# Patient Record
Sex: Female | Born: 1980
Health system: Southern US, Community
[De-identification: ages and names within clinical notes are randomized; demographics above are authoritative.]

## PROBLEM LIST (undated history)

## (undated) ENCOUNTER — Emergency Department (HOSPITAL_COMMUNITY): Admission: EM | Payer: BLUE CROSS/BLUE SHIELD | Source: Home / Self Care

## (undated) DIAGNOSIS — M459 Ankylosing spondylitis of unspecified sites in spine: Secondary | ICD-10-CM

## (undated) DIAGNOSIS — I201 Angina pectoris with documented spasm: Principal | ICD-10-CM

## (undated) HISTORY — DX: Ankylosing spondylitis of unspecified sites in spine: M45.9

## (undated) HISTORY — PX: NOSE SURGERY: SHX723

## (undated) HISTORY — DX: Angina pectoris with documented spasm: I20.1

---

## 2003-08-30 ENCOUNTER — Ambulatory Visit (HOSPITAL_COMMUNITY): Admission: RE | Admit: 2003-08-30 | Discharge: 2003-08-30 | Payer: Self-pay | Admitting: Preventative Medicine

## 2003-10-20 ENCOUNTER — Ambulatory Visit (HOSPITAL_COMMUNITY): Admission: RE | Admit: 2003-10-20 | Discharge: 2003-10-20 | Payer: Self-pay | Admitting: Internal Medicine

## 2004-06-20 ENCOUNTER — Ambulatory Visit (HOSPITAL_COMMUNITY): Admission: RE | Admit: 2004-06-20 | Discharge: 2004-06-20 | Payer: Self-pay | Admitting: Internal Medicine

## 2005-01-31 ENCOUNTER — Ambulatory Visit (HOSPITAL_COMMUNITY): Admission: RE | Admit: 2005-01-31 | Discharge: 2005-01-31 | Payer: Self-pay | Admitting: Orthopaedic Surgery

## 2007-09-17 HISTORY — PX: TONSILLECTOMY: SUR1361

## 2010-09-16 HISTORY — PX: TUBAL LIGATION: SHX77

## 2010-09-16 HISTORY — PX: ABLATION: SHX5711

## 2011-08-02 ENCOUNTER — Other Ambulatory Visit (HOSPITAL_COMMUNITY): Payer: Self-pay | Admitting: Pediatrics

## 2011-08-02 ENCOUNTER — Other Ambulatory Visit (HOSPITAL_COMMUNITY): Payer: Self-pay

## 2011-08-02 DIAGNOSIS — R05 Cough: Secondary | ICD-10-CM

## 2012-04-02 DIAGNOSIS — R079 Chest pain, unspecified: Secondary | ICD-10-CM

## 2013-03-06 ENCOUNTER — Encounter (HOSPITAL_COMMUNITY): Payer: Self-pay | Admitting: Emergency Medicine

## 2013-03-06 ENCOUNTER — Emergency Department (HOSPITAL_COMMUNITY): Payer: BC Managed Care – PPO

## 2013-03-06 ENCOUNTER — Emergency Department (HOSPITAL_COMMUNITY)
Admission: EM | Admit: 2013-03-06 | Discharge: 2013-03-06 | Disposition: A | Discharge status: Home / Self Care | Payer: BC Managed Care – PPO | Attending: Emergency Medicine | Admitting: Emergency Medicine

## 2013-03-06 DIAGNOSIS — S93409A Sprain of unspecified ligament of unspecified ankle, initial encounter: Secondary | ICD-10-CM | POA: Insufficient documentation

## 2013-03-06 DIAGNOSIS — Z79899 Other long term (current) drug therapy: Secondary | ICD-10-CM | POA: Insufficient documentation

## 2013-03-06 DIAGNOSIS — Y92009 Unspecified place in unspecified non-institutional (private) residence as the place of occurrence of the external cause: Secondary | ICD-10-CM | POA: Insufficient documentation

## 2013-03-06 DIAGNOSIS — Y9389 Activity, other specified: Secondary | ICD-10-CM | POA: Insufficient documentation

## 2013-03-06 DIAGNOSIS — S93401A Sprain of unspecified ligament of right ankle, initial encounter: Secondary | ICD-10-CM

## 2013-03-06 DIAGNOSIS — F17200 Nicotine dependence, unspecified, uncomplicated: Secondary | ICD-10-CM | POA: Insufficient documentation

## 2013-03-06 DIAGNOSIS — R296 Repeated falls: Secondary | ICD-10-CM | POA: Insufficient documentation

## 2013-03-06 MED ORDER — HYDROCODONE-ACETAMINOPHEN 5-325 MG PO TABS
1.0000 | ORAL_TABLET | Freq: Once | ORAL | Status: AC
  Administered 2013-03-06: 1 via ORAL
  Filled 2013-03-06: qty 1

## 2013-03-06 MED ORDER — HYDROCODONE-ACETAMINOPHEN 5-325 MG PO TABS
ORAL_TABLET | ORAL | Status: DC
Start: 2013-03-06 — End: 2014-02-14

## 2013-03-06 NOTE — ED Notes (Signed)
Injury to right ankle - fell off a porch- pain and mild swelling

## 2013-03-06 NOTE — ED Provider Notes (Signed)
History     CSN: [id number]  Arrival date & time 03/06/13  4696   First MD Initiated Contact with Patient 03/06/13 2026      Chief Complaint  Patient presents with  . Ankle Injury    (Consider location/radiation/quality/duration/timing/severity/associated sxs/prior treatment) Patient is a 32 y.o. female presenting with ankle pain. The history is provided by the patient.  Ankle Pain Location:  Ankle Injury: yes   Mechanism of injury: fall   Fall:    Fall occurred: fell short distance off a Scientist, physiological.     Impact surface:  Dirt   Point of impact:  Feet   Entrapped after fall: no   Ankle location:  R ankle Pain details:    Quality:  Aching and throbbing   Radiates to:  Does not radiate   Severity:  Moderate   Onset quality:  Sudden   Timing:  Constant   Progression:  Unchanged Chronicity:  New Dislocation: no   Foreign body present:  No foreign bodies Prior injury to area:  No Relieved by:  Nothing Worsened by:  Activity and bearing weight Ineffective treatments:  Elevation and ice Associated symptoms: decreased ROM and swelling   Associated symptoms: no back pain, no fatigue, no fever, no muscle weakness, no neck pain, no numbness and no tingling     History reviewed. No pertinent past medical history.  Past Surgical History  Procedure Laterality Date  . Tonsillectomy    . Cesarean section      x 2   . Tubal ligation      No family history on file.  History  Substance Use Topics  . Smoking status: Current Every Day Smoker -- 1.00 packs/day  . Smokeless tobacco: Not on file  . Alcohol Use: Yes     Comment: occasionally    OB History   Grav Para Term Preterm Abortions TAB SAB Ect Mult Living                  Review of Systems  Constitutional: Negative for fever, chills and fatigue.  HENT: Negative for neck pain.   Genitourinary: Negative for dysuria and difficulty urinating.  Musculoskeletal: Positive for joint swelling and arthralgias. Negative for  back pain.  Skin: Negative for color change and wound.  All other systems reviewed and are negative.    Allergies  Wellbutrin  Home Medications   Current Outpatient Rx  Name  Route  Sig  Dispense  Refill  . DULoxetine (CYMBALTA) 30 MG capsule   Oral   Take 30 mg by mouth daily.         . Multiple Vitamin (MULTIVITAMIN) tablet   Oral   Take 1 tablet by mouth daily.         Marland Kitchen HYDROcodone-acetaminophen (NORCO/VICODIN) 5-325 MG per tablet      Take one-two tabs po q 4-6 hrs prn pain   20 tablet   0     BP 102/54  Pulse 102  Temp(Src) 98.2 F (36.8 C) (Oral)  Resp 18  Ht 5\' 3"  (1.6 m)  Wt 149 lb (67.586 kg)  BMI 26.4 kg/m2  SpO2 100%  Physical Exam  Nursing note and vitals reviewed. Constitutional: She is oriented to person, place, and time. She appears well-developed and well-nourished. No distress.  HENT:  Head: Normocephalic and atraumatic.  Cardiovascular: Normal rate, regular rhythm, normal heart sounds and intact distal pulses.   Pulmonary/Chest: Effort normal and breath sounds normal.  Musculoskeletal: She exhibits edema and tenderness.  Right lateral  ankle is ttp, mild STS is present.  DP pulse is brisk, distal sensation intact.  No erythema, abrasion,  or bony deformity.  No proximal tenderness    Neurological: She is alert and oriented to person, place, and time. She exhibits normal muscle tone. Coordination normal.  Skin: Skin is warm and dry.    ED Course  Procedures (including critical care time)  Labs Reviewed - No data to display Dg Ankle Complete Right  03/06/2013   *RADIOLOGY REPORT*  Clinical Data: Twisted ankle.  RIGHT ANKLE - COMPLETE 3+ VIEW  Comparison: None.  Findings: The ankle mortise is maintained.  No acute ankle fracture or osteochondral lesion.  Possible small avulsion fracture off the lateral calcaneus.  IMPRESSION:  1.  No acute ankle fracture. 2.  Possible lateral calcaneal avulsion fracture.  Recommend clinical correlation.    Original Report Authenticated By: Rudie Meyer, M.D.     1. Sprain of ankle, right, initial encounter       MDM     Localized ttp of the right lateral ankle.  Poss avulsion injury per x-ray but pt is NT at the calcaneus.  ASO splint applied and crutches given.  She agrees to RICE therapy and close f/u with orthopedics.  referral info given.       Khloei Spiker L. Trisha Mangle, PA-C 03/09/13 1556

## 2013-03-10 NOTE — ED Provider Notes (Signed)
Medical screening examination/treatment/procedure(s) were performed by non-physician practitioner and as supervising physician I was immediately available for consultation/collaboration.  Donnetta Hutching, MD 03/10/13 [phone]

## 2013-12-15 ENCOUNTER — Encounter: Payer: Self-pay | Admitting: Neurology

## 2013-12-20 ENCOUNTER — Ambulatory Visit: Payer: BC Managed Care – PPO | Admitting: Neurology

## 2014-02-14 ENCOUNTER — Ambulatory Visit (INDEPENDENT_AMBULATORY_CARE_PROVIDER_SITE_OTHER): Payer: BC Managed Care – PPO | Admitting: Neurology

## 2014-02-14 ENCOUNTER — Encounter: Payer: Self-pay | Admitting: Neurology

## 2014-02-14 VITALS — BP 105/69 | HR 93 | Ht 64.5 in | Wt 156.0 lb

## 2014-02-14 DIAGNOSIS — R202 Paresthesia of skin: Secondary | ICD-10-CM | POA: Insufficient documentation

## 2014-02-14 DIAGNOSIS — G8929 Other chronic pain: Secondary | ICD-10-CM

## 2014-02-14 DIAGNOSIS — R209 Unspecified disturbances of skin sensation: Secondary | ICD-10-CM

## 2014-02-14 NOTE — Progress Notes (Signed)
[id number]    Provider:  Dr [name] Referring Provider: Valeria Batman, MD Primary Care Physician:  No primary provider on file.  CC:  Hip pain  HPI:  Ashlee [name] is a 33 y.o. female here as a referral from Dr. Cleophas Dunker for hip pain  Per records, symptoms began in 2006, all of a sudden experienced hip pain, no associated trauma. Was initially diagnosed with bursitis, had normal MRI L spine in 2006. Describes pain over both trochanteric bursa regions. Has tried cortisone injections, oral prednisone, analgesics with no benefit. Taking prednisone does seem to help. No groin, thigh or knee pain. She does note some popping and clicking of her hips bilaterally. Had MRI arthrogram of right hip which was normal. Describes pain as intermittent, will "flare up", the flare ups are severe enough that she has difficulty walking. Can get severe aching pain all the way down to her ankles. Denies any bowel bladder difficulties  Had question of colitis in 2004, had colonoscopy which was normal.   Review of Systems: Out of a complete 14 system review, the patient complains of only the following symptoms, and all other reviewed systems are negative. + chronic pain, allergies  History   Social History  . Marital Status: Married    Spouse Name: N/A    Number of Children: 3  . Years of Education: N/A   Occupational History  . office manager    Social History Main Topics  . Smoking status: Current Every Day Smoker -- 1.00 packs/day    Types: E-cigarettes  . Smokeless tobacco: Never Used     Comment: Quit in 07/2013  . Alcohol Use: Yes     Comment: occasionally  . Drug Use: No  . Sexual Activity: Yes   Other Topics Concern  . Not on file   Social History Narrative   Married with 3 children   Right handed   12 th   1 cup daily          No family history on file.  Past Medical History  Diagnosis Date  . Anxiety     Past Surgical History  Procedure  Laterality Date  . Tonsillectomy  2009  . Cesarean section  2007, 2012    x 2   . Tubal ligation  2012  . Ablation  2012    Current Outpatient Prescriptions  Medication Sig Dispense Refill  . DULoxetine (CYMBALTA) 30 MG capsule Take 30 mg by mouth daily.       No current facility-administered medications for this visit.    Allergies as of 02/14/2014 - Review Complete 02/14/2014  Allergen Reaction Noted  . Wellbutrin [bupropion] Hives 03/06/2013    Vitals: BP 105/69  Pulse 93  Ht 5' 4.5" (1.638 m)  Wt 156 lb (70.761 kg)  BMI 26.37 kg/m2 Last Weight:  Wt Readings from Last 1 Encounters:  02/14/14 156 lb (70.761 kg)   Last Height:   Ht Readings from Last 1 Encounters:  02/14/14 5' 4.5" (1.638 m)     Physical exam: Exam: Gen: NAD, conversant Eyes: anicteric sclerae, moist conjunctivae HENT: Atraumatic, oropharynx clear Neck: Trachea midline; supple,  Lungs: CTA, no wheezing, rales, rhonic                          CV: RRR, no MRG Abdomen: Soft, non-tender;  Extremities: No peripheral edema  Skin: Normal temperature, no rash,  Psych: Appropriate affect, pleasant  Neuro: MS: AA&Ox3,  appropriately interactive, normal affect   Attention: WORLD backwards  Speech: fluent w/o paraphasic error  Memory: good recent and remote recall  CN: PERRL, EOMI no nystagmus, no ptosis, sensation intact to LT V1-V3 bilat, face symmetric, no weakness, hearing grossly intact, palate elevates symmetrically, shoulder shrug 5/5 bilat,  tongue protrudes midline, no fasiculations noted.  Motor: normal bulk and tone Strength: 5/5  In all extremities  Coord: rapid alternating and point-to-point (FNF, HTS) movements intact.  Reflexes: symmetrical, bilat downgoing toes  Sens: LT intact in all extremities, intact PP, temp, vibration bilateral LE  Gait: posture, stance, stride and arm-swing normal. Tandem gait intact. Able to walk on heels and toes. Romberg absent.   Assessment:   After physical and neurologic examination, review of laboratory studies, imaging, neurophysiology testing and pre-existing records, assessment will be reviewed on the problem list.  Plan:  Treatment plan and additional workup will be reviewed under Problem List.  1)Chronic hip pain  33y/o woman presenting for initial evaluation of chronic hip pain that involves both hips (though only one at a time). Symptoms have been ongoing for >9 years, has had extensive orthopaedic and gyn workup with no clear etiology. Has had normal lumbar and hip MRIs. Unclear etiology of symptoms. Atypical for a meralagia paresthetica as pain radiates below the knee. Will check EMG/NCS and ANA and RF. Follow up once workup is completed.    Elspeth ChoPeter Layza Summa, DO  [facility] [address] [place], [place] [address]  Phone [phone] Fax [phone]

## 2014-02-14 NOTE — Patient Instructions (Addendum)
Overall you are doing fairly well but I do want to suggest a few things today:   As far as diagnostic testing:  1)Please have some blood work checked today 2)Please schedule an EMG/Nerve conduction study when you check out  Follow up once the workup is completed. Please call us with any interim questions, concerns, problems, updates or refill requests.   My clinical assistant and will answer any of your questions and relay your messages to me and also relay most of my messages to you.   Our phone number is [phone]. We also have an after hours call service for urgent matters and there is a physician on-call for urgent questions. For any emergencies you know to call 911 or go to the nearest emergency room

## 2014-02-15 ENCOUNTER — Telehealth: Payer: Self-pay | Admitting: *Deleted

## 2014-02-15 LAB — ANA W/REFLEX IF POSITIVE: Anti Nuclear Antibody(ANA): NEGATIVE

## 2014-02-15 LAB — RHEUMATOID FACTOR: Rheumatoid fact SerPl-aCnc: 7.4 IU/mL (ref 0.0–13.9)

## 2014-02-15 NOTE — Telephone Encounter (Signed)
Pt calling requesting lab results  Please advise

## 2014-02-15 NOTE — Telephone Encounter (Signed)
Please let her know her labs are normal. We will follow up after her EMG. Thanks

## 2014-02-16 NOTE — Telephone Encounter (Signed)
Called pt to inform pt per Dr. [name] that pt's lab work results were normal and that Dr. [name] will follow up after pt's EMG. I advised the pt that if she has any other problems, questions or concerns to call the office. Pt verbalized understanding.

## 2014-03-01 ENCOUNTER — Ambulatory Visit (INDEPENDENT_AMBULATORY_CARE_PROVIDER_SITE_OTHER): Payer: BC Managed Care – PPO | Admitting: Neurology

## 2014-03-01 ENCOUNTER — Encounter (INDEPENDENT_AMBULATORY_CARE_PROVIDER_SITE_OTHER): Payer: Self-pay

## 2014-03-01 DIAGNOSIS — M79609 Pain in unspecified limb: Secondary | ICD-10-CM

## 2014-03-01 DIAGNOSIS — Z0289 Encounter for other administrative examinations: Secondary | ICD-10-CM

## 2014-03-01 DIAGNOSIS — G8929 Other chronic pain: Secondary | ICD-10-CM

## 2014-03-01 DIAGNOSIS — M25559 Pain in unspecified hip: Secondary | ICD-10-CM

## 2014-03-01 DIAGNOSIS — R202 Paresthesia of skin: Secondary | ICD-10-CM

## 2014-03-01 NOTE — Procedures (Signed)
     HISTORY:  Ashlee [name] is a 33 year old patient with a 9 year history of intermittent discomfort involving the hips, occasionally going down into the knees and ankles bilaterally. The symptoms have become more persistent over the last one year. The patient is being evaluated for a possible neuropathy or a lumbosacral radiculopathy. MRI study of the lumbosacral spine done previously reportedly did not show any etiology for her discomfort.  NERVE CONDUCTION STUDIES:  Nerve conduction studies were performed on both lower extremities. The distal motor latencies and motor amplitudes for the peroneal and posterior tibial nerves were within normal limits. The nerve conduction velocities for these nerves were also normal. The H reflex latencies were normal. The sensory latencies for the peroneal nerves were within normal limits.   EMG STUDIES:  EMG study was performed on the right lower extremity:  The tibialis anterior muscle reveals 2 to 4K motor units with full recruitment. No fibrillations or positive waves were seen. The peroneus tertius muscle reveals 2 to 4K motor units with full recruitment. No fibrillations or positive waves were seen. The medial gastrocnemius muscle reveals 1 to 3K motor units with full recruitment. No fibrillations or positive waves were seen. The vastus lateralis muscle reveals 2 to 4K motor units with full recruitment. No fibrillations or positive waves were seen. The iliopsoas muscle reveals 2 to 4K motor units with full recruitment. No fibrillations or positive waves were seen. The biceps femoris muscle (long head) reveals 2 to 4K motor units with full recruitment. No fibrillations or positive waves were seen. The lumbosacral paraspinal muscles were tested at 3 levels, and revealed no abnormalities of insertional activity at all 3 levels tested. There was good relaxation.  EMG study was performed on the left lower extremity:  The tibialis anterior muscle reveals 2  to 4K motor units with full recruitment. No fibrillations or positive waves were seen. The peroneus tertius muscle reveals 2 to 4K motor units with full recruitment. No fibrillations or positive waves were seen. The medial gastrocnemius muscle reveals 1 to 3K motor units with full recruitment. No fibrillations or positive waves were seen. The vastus lateralis muscle reveals 2 to 4K motor units with full recruitment. No fibrillations or positive waves were seen. The iliopsoas muscle reveals 2 to 4K motor units with full recruitment. No fibrillations or positive waves were seen. The biceps femoris muscle (long head) reveals 2 to 4K motor units with full recruitment. No fibrillations or positive waves were seen. The lumbosacral paraspinal muscles were tested at 3 levels, and revealed no abnormalities of insertional activity at all 3 levels tested. There was good relaxation.   IMPRESSION:  Nerve conduction studies done on both lower extremities were within normal limits. No evidence of a peripheral neuropathy is seen. EMG evaluation of both lower extremities were unremarkable, without evidence of an overlying lumbosacral radiculopathy on either side.  Marlan Palau. Keith Willis MD 03/01/2014 11:06 AM  [facility] [address] [place], [place] [address]  Phone [phone] Fax [phone]

## 2014-03-02 ENCOUNTER — Telehealth: Payer: Self-pay | Admitting: Neurology

## 2014-03-02 ENCOUNTER — Other Ambulatory Visit: Payer: Self-pay | Admitting: Neurology

## 2014-03-02 NOTE — Telephone Encounter (Signed)
Pt calling requesting an order for lab work for lime disease. Pt stated that Dr. Anne HahnWillis mentioned it at her EMG that she had done on yesterday. Pt [facility]. Please advise

## 2014-03-02 NOTE — Telephone Encounter (Signed)
Order faxed.

## 2014-03-02 NOTE — Progress Notes (Signed)
Quick Note:  Left voice message of normal EEG/NCS, instructed patient to call back with any questions or concerns. ______

## 2014-03-02 NOTE — Telephone Encounter (Signed)
Patient needing an for Blood drawn for lime disease test faxed PCP's Lab.  PCP phone [phone] [facility]

## 2014-03-04 ENCOUNTER — Other Ambulatory Visit: Payer: Self-pay | Admitting: *Deleted

## 2014-03-04 ENCOUNTER — Other Ambulatory Visit: Payer: Self-pay | Admitting: Neurology

## 2014-03-04 DIAGNOSIS — R209 Unspecified disturbances of skin sensation: Secondary | ICD-10-CM

## 2014-03-04 NOTE — Progress Notes (Signed)
Kim from [facility] called. [phone].  I placed order in epic, although already drawn.

## 2014-03-07 LAB — B. BURGDORFI ANTIBODIES: B burgdorferi Ab IgG+IgM: 0.65 {ISR}

## 2014-03-08 NOTE — Telephone Encounter (Signed)
I called and spoke with Ashlee BattenKim at [facility] about Lyme titer results.  [phone].  She will fax to us.

## 2014-03-08 NOTE — Telephone Encounter (Signed)
Patient calling to get results from Blood work.   Please return call @ [phone].

## 2014-03-09 NOTE — Telephone Encounter (Signed)
Called patient home and left a message that her labs were normal. Called patient cell as well.

## 2014-03-09 NOTE — Telephone Encounter (Signed)
Message copied by Ardeth SportsmanHODES, Dava Rensch L on Wed Mar 09, 2014  2:25 PM ------      Message from: Ramond MarrowSUMNER, PETER J      Created: Wed Mar 09, 2014  1:14 PM       Please let her know her labs are normal. Thanks. ------

## 2014-07-06 ENCOUNTER — Telehealth: Payer: Self-pay | Admitting: Neurology

## 2014-07-06 DIAGNOSIS — M25551 Pain in right hip: Secondary | ICD-10-CM

## 2014-07-06 DIAGNOSIS — M25552 Pain in left hip: Principal | ICD-10-CM

## 2014-07-06 NOTE — Telephone Encounter (Signed)
I called patient. The patient continues to have episodes of hip discomfort, and within the last 2 months, she has noted a rash around the eyes that occurs with the hip discomfort. We will set her up for a rheumatologic evaluation.

## 2014-07-06 NOTE — Telephone Encounter (Signed)
Patient former [name] patient, requestinHosie Poissong referral to Rheumatologist, Dr. Robley FriesJames Bekman fax # [phone].  Please call and advise.

## 2014-07-06 NOTE — Telephone Encounter (Signed)
Patient was seen once by Dr. Hosie PoissonSumner on 02-14-14, does she need to be seen again before a referral can be made?  He made no mention of a rheumatology referral.

## 2014-07-14 ENCOUNTER — Encounter (HOSPITAL_COMMUNITY): Payer: Self-pay | Admitting: Emergency Medicine

## 2014-07-14 ENCOUNTER — Emergency Department (HOSPITAL_COMMUNITY): Payer: BC Managed Care – PPO

## 2014-07-14 ENCOUNTER — Emergency Department (HOSPITAL_COMMUNITY)
Admission: EM | Admit: 2014-07-14 | Discharge: 2014-07-14 | Disposition: A | Discharge status: Home / Self Care | Payer: BC Managed Care – PPO | Attending: Emergency Medicine | Admitting: Emergency Medicine

## 2014-07-14 DIAGNOSIS — R1031 Right lower quadrant pain: Secondary | ICD-10-CM | POA: Diagnosis present

## 2014-07-14 DIAGNOSIS — Z79899 Other long term (current) drug therapy: Secondary | ICD-10-CM | POA: Insufficient documentation

## 2014-07-14 DIAGNOSIS — Z3202 Encounter for pregnancy test, result negative: Secondary | ICD-10-CM | POA: Diagnosis not present

## 2014-07-14 DIAGNOSIS — F419 Anxiety disorder, unspecified: Secondary | ICD-10-CM | POA: Insufficient documentation

## 2014-07-14 DIAGNOSIS — Z9851 Tubal ligation status: Secondary | ICD-10-CM | POA: Insufficient documentation

## 2014-07-14 DIAGNOSIS — R197 Diarrhea, unspecified: Secondary | ICD-10-CM | POA: Insufficient documentation

## 2014-07-14 DIAGNOSIS — R52 Pain, unspecified: Secondary | ICD-10-CM

## 2014-07-14 DIAGNOSIS — R110 Nausea: Secondary | ICD-10-CM | POA: Insufficient documentation

## 2014-07-14 DIAGNOSIS — N83202 Unspecified ovarian cyst, left side: Secondary | ICD-10-CM

## 2014-07-14 DIAGNOSIS — Z720 Tobacco use: Secondary | ICD-10-CM | POA: Insufficient documentation

## 2014-07-14 DIAGNOSIS — N8320 Unspecified ovarian cysts: Secondary | ICD-10-CM | POA: Diagnosis not present

## 2014-07-14 LAB — URINALYSIS, ROUTINE W REFLEX MICROSCOPIC
Bilirubin Urine: NEGATIVE
Glucose, UA: NEGATIVE mg/dL
Hgb urine dipstick: NEGATIVE
Ketones, ur: NEGATIVE mg/dL
Leukocytes, UA: NEGATIVE
Nitrite: NEGATIVE
Protein, ur: NEGATIVE mg/dL
Specific Gravity, Urine: 1.01 (ref 1.005–1.030)
Urobilinogen, UA: 0.2 mg/dL (ref 0.0–1.0)
pH: 6.5 (ref 5.0–8.0)

## 2014-07-14 LAB — CBC WITH DIFFERENTIAL/PLATELET
Basophils Absolute: 0 10*3/uL (ref 0.0–0.1)
Basophils Relative: 0 % (ref 0–1)
Eosinophils Absolute: 0.2 10*3/uL (ref 0.0–0.7)
Eosinophils Relative: 3 % (ref 0–5)
HCT: 44.6 % (ref 36.0–46.0)
Hemoglobin: 15.2 g/dL — ABNORMAL HIGH (ref 12.0–15.0)
Lymphocytes Relative: 24 % (ref 12–46)
Lymphs Abs: 2 10*3/uL (ref 0.7–4.0)
MCH: 30.2 pg (ref 26.0–34.0)
MCHC: 34.1 g/dL (ref 30.0–36.0)
MCV: 88.5 fL (ref 78.0–100.0)
Monocytes Absolute: 0.4 10*3/uL (ref 0.1–1.0)
Monocytes Relative: 5 % (ref 3–12)
Neutro Abs: 5.6 10*3/uL (ref 1.7–7.7)
Neutrophils Relative %: 68 % (ref 43–77)
Platelets: 295 10*3/uL (ref 150–400)
RBC: 5.04 MIL/uL (ref 3.87–5.11)
RDW: 12.5 % (ref 11.5–15.5)
WBC: 8.3 10*3/uL (ref 4.0–10.5)

## 2014-07-14 LAB — COMPREHENSIVE METABOLIC PANEL
ALT: 13 U/L (ref 0–35)
AST: 19 U/L (ref 0–37)
Albumin: 4.9 g/dL (ref 3.5–5.2)
Alkaline Phosphatase: 71 U/L (ref 39–117)
Anion gap: 15 (ref 5–15)
BUN: 9 mg/dL (ref 6–23)
CO2: 23 mEq/L (ref 19–32)
Calcium: 10 mg/dL (ref 8.4–10.5)
Chloride: 104 mEq/L (ref 96–112)
Creatinine, Ser: 0.78 mg/dL (ref 0.50–1.10)
GFR calc Af Amer: >90 mL/min (ref >90)
GFR calc non Af Amer: >90 mL/min (ref >90)
Glucose, Bld: 94 mg/dL (ref 70–99)
Potassium: 3.8 mEq/L (ref 3.7–5.3)
Sodium: 142 mEq/L (ref 137–147)
Total Bilirubin: 0.3 mg/dL (ref 0.3–1.2)
Total Protein: 8.9 g/dL — ABNORMAL HIGH (ref 6.0–8.3)

## 2014-07-14 LAB — PREGNANCY, URINE: Preg Test, Ur: NEGATIVE

## 2014-07-14 MED ORDER — IOHEXOL 300 MG/ML  SOLN
50.0000 mL | Freq: Once | INTRAMUSCULAR | Status: AC | PRN
  Administered 2014-07-14: 50 mL via ORAL

## 2014-07-14 MED ORDER — HYDROCODONE-ACETAMINOPHEN 5-325 MG PO TABS: 1.0000 | ORAL_TABLET | Freq: Four times a day (QID) | ORAL | Status: DC | PRN

## 2014-07-14 MED ORDER — IOHEXOL 300 MG/ML  SOLN
100.0000 mL | Freq: Once | INTRAMUSCULAR | Status: AC | PRN
  Administered 2014-07-14: 100 mL via INTRAVENOUS

## 2014-07-14 NOTE — ED Notes (Signed)
Patient was not able to give urine. Patient advised me that she would let someone know when she could use the restroom.

## 2014-07-14 NOTE — ED Notes (Signed)
Pt reports intermittent n/v and abd pain x 1.5 weeks. Pt reports diarrhea last week and intermittent low grade fevers.

## 2014-07-14 NOTE — ED Notes (Signed)
Pain RLQ for 9 days, nausea, no vomited.  No appetite,  Diarrhea last week.  No urinary sx,

## 2014-07-14 NOTE — Discharge Instructions (Signed)
Follow up with your gyn md next week. °

## 2014-07-14 NOTE — ED Provider Notes (Signed)
CSN: [id number]     Arrival date & time 07/14/14  1701 History  This chart was scribed for Benny LennertJoseph L Ishaq Maffei, MD by Richarda Overlieichard Holland, ED Scribe. This patient was seen in room APA08/APA08 and the patient's care was started 5:18 PM.    Chief Complaint  Patient presents with  . Abdominal Pain   Patient is a 33 y.o. female presenting with abdominal pain. The history is provided by the patient. No language interpreter was used.  Abdominal Pain Pain location:  RLQ Pain radiates to:  Does not radiate Pain severity:  Mild Onset quality:  Gradual Duration:  9 days Timing:  Constant Progression:  Worsening Associated symptoms: diarrhea, fever and nausea   Associated symptoms: no chest pain, no cough, no dysuria, no fatigue, no hematemesis, no hematuria and no vomiting    HPI Comments: Ashlee FileSara D [name] is a 33 y.o. female who presents to the Emergency Department complaining of constant, gradually worsening abdominal pain for the past 9 days in her RLQ. She reports associated nausea and intermittent fever that started today. She reports she has a history of ovarian cysts, tubal ligation, and ablation. Per nursing, pt reports diarrhea as a symptom last week. She denies any urinary symptoms.   [facility]   Past Medical History  Diagnosis Date  . Anxiety    Past Surgical History  Procedure Laterality Date  . Tonsillectomy  2009  . Cesarean section  2007, 2012    x 2   . Tubal ligation  2012  . Ablation  2012   History reviewed. No pertinent family history. History  Substance Use Topics  . Smoking status: Current Every Day Smoker -- 1.00 packs/day    Types: E-cigarettes  . Smokeless tobacco: Never Used     Comment: Quit in 07/2013  . Alcohol Use: Yes     Comment: occasionally   OB History   Grav Para Term Preterm Abortions TAB SAB Ect Mult Living                 Review of Systems  Constitutional: Positive for fever. Negative for appetite change and fatigue.  HENT: Negative for  congestion, ear discharge and sinus pressure.   Eyes: Negative for discharge.  Respiratory: Negative for cough.   Cardiovascular: Negative for chest pain.  Gastrointestinal: Positive for nausea, abdominal pain and diarrhea. Negative for vomiting and hematemesis.  Genitourinary: Negative for dysuria, frequency and hematuria.  Musculoskeletal: Negative for back pain.  Skin: Negative for rash.  Neurological: Negative for seizures and headaches.  Psychiatric/Behavioral: Negative for hallucinations.      Allergies  Wellbutrin  Home Medications   Prior to Admission medications   Medication Sig Start Date End Date Taking? Authorizing Provider  DULoxetine (CYMBALTA) 30 MG capsule Take 30 mg by mouth daily.    Historical Provider, MD   BP 118/76  Pulse 101  Temp(Src) 99.4 F (37.4 C) (Oral)  Resp 18  Ht 5\' 4"  (1.626 m)  Wt 146 lb (66.225 kg)  BMI 25.05 kg/m2  SpO2 100% Physical Exam  Nursing note and vitals reviewed. Constitutional: She is oriented to person, place, and time. She appears well-developed.  HENT:  Head: Normocephalic.  Eyes: Conjunctivae and EOM are normal. No scleral icterus.  Neck: Neck supple. No thyromegaly present.  Cardiovascular: Normal rate and regular rhythm.  Exam reveals no gallop and no friction rub.   No murmur heard. Pulmonary/Chest: No stridor. She has no wheezes. She has no rales. She exhibits no tenderness.  Abdominal: She exhibits no distension. There is tenderness. There is no rebound.  RLQ tenderness   Musculoskeletal: Normal range of motion. She exhibits no edema.  Lymphadenopathy:    She has no cervical adenopathy.  Neurological: She is oriented to person, place, and time. She exhibits normal muscle tone. Coordination normal.  Skin: No rash noted. No erythema.  Psychiatric: She has a normal mood and affect. Her behavior is normal.    ED Course  Procedures  DIAGNOSTIC STUDIES: Oxygen Saturation is 100% on RA, normal by my  interpretation.    COORDINATION OF CARE: 5:22 PM Discussed treatment plan with pt at bedside and pt agreed to plan.   Labs Review Labs Reviewed  CBC WITH DIFFERENTIAL - Abnormal; Notable for the following:    Hemoglobin 15.2 (*)    All other components within normal limits  COMPREHENSIVE METABOLIC PANEL - Abnormal; Notable for the following:    Total Protein 8.9 (*)    All other components within normal limits  URINALYSIS, ROUTINE W REFLEX MICROSCOPIC  PREGNANCY, URINE    Imaging Review No results found.   EKG Interpretation None      MDM   Final diagnoses:  None   Ovarian cyst,  tx with vicodin and follow up The chart was scribed for me under my direct supervision.  I personally performed the history, physical, and medical decision making and all procedures in the evaluation of this patient.Benny Lennert.     Amri Lien L Rochell Mabie, MD 07/14/14 [phone]

## 2014-07-21 ENCOUNTER — Telehealth: Payer: Self-pay

## 2014-07-21 NOTE — Telephone Encounter (Signed)
Called patient to verify appt with Dr. Dierdre ForthBeekman on 08/16/14 @ 1:30 arrival time 1:15. Pt confirmed she is aware.

## 2016-02-06 DIAGNOSIS — Z79899 Other long term (current) drug therapy: Secondary | ICD-10-CM | POA: Diagnosis not present

## 2016-02-06 DIAGNOSIS — M450 Ankylosing spondylitis of multiple sites in spine: Secondary | ICD-10-CM | POA: Diagnosis not present

## 2016-02-06 DIAGNOSIS — R21 Rash and other nonspecific skin eruption: Secondary | ICD-10-CM | POA: Diagnosis not present

## 2016-02-07 DIAGNOSIS — Z6823 Body mass index (BMI) 23.0-23.9, adult: Secondary | ICD-10-CM | POA: Diagnosis not present

## 2016-02-07 DIAGNOSIS — J343 Hypertrophy of nasal turbinates: Secondary | ICD-10-CM | POA: Diagnosis not present

## 2016-02-07 DIAGNOSIS — J069 Acute upper respiratory infection, unspecified: Secondary | ICD-10-CM | POA: Diagnosis not present

## 2016-02-07 DIAGNOSIS — J329 Chronic sinusitis, unspecified: Secondary | ICD-10-CM | POA: Diagnosis not present

## 2016-02-07 DIAGNOSIS — R070 Pain in throat: Secondary | ICD-10-CM | POA: Diagnosis not present

## 2016-02-08 DIAGNOSIS — Z6823 Body mass index (BMI) 23.0-23.9, adult: Secondary | ICD-10-CM | POA: Diagnosis not present

## 2016-02-08 DIAGNOSIS — J069 Acute upper respiratory infection, unspecified: Secondary | ICD-10-CM | POA: Diagnosis not present

## 2016-02-08 DIAGNOSIS — Z1389 Encounter for screening for other disorder: Secondary | ICD-10-CM | POA: Diagnosis not present

## 2016-03-05 DIAGNOSIS — L820 Inflamed seborrheic keratosis: Secondary | ICD-10-CM | POA: Diagnosis not present

## 2016-03-05 DIAGNOSIS — Z1283 Encounter for screening for malignant neoplasm of skin: Secondary | ICD-10-CM | POA: Diagnosis not present

## 2016-03-05 DIAGNOSIS — D225 Melanocytic nevi of trunk: Secondary | ICD-10-CM | POA: Diagnosis not present

## 2016-03-29 DIAGNOSIS — Z1151 Encounter for screening for human papillomavirus (HPV): Secondary | ICD-10-CM | POA: Diagnosis not present

## 2016-03-29 DIAGNOSIS — Z01419 Encounter for gynecological examination (general) (routine) without abnormal findings: Secondary | ICD-10-CM | POA: Diagnosis not present

## 2016-03-29 DIAGNOSIS — R8290 Unspecified abnormal findings in urine: Secondary | ICD-10-CM | POA: Diagnosis not present

## 2016-04-02 DIAGNOSIS — R8290 Unspecified abnormal findings in urine: Secondary | ICD-10-CM | POA: Diagnosis not present

## 2016-04-02 DIAGNOSIS — Z79899 Other long term (current) drug therapy: Secondary | ICD-10-CM | POA: Diagnosis not present

## 2016-04-02 DIAGNOSIS — R21 Rash and other nonspecific skin eruption: Secondary | ICD-10-CM | POA: Diagnosis not present

## 2016-04-02 DIAGNOSIS — M545 Low back pain: Secondary | ICD-10-CM | POA: Diagnosis not present

## 2016-04-02 DIAGNOSIS — M450 Ankylosing spondylitis of multiple sites in spine: Secondary | ICD-10-CM | POA: Diagnosis not present

## 2016-04-09 ENCOUNTER — Ambulatory Visit: Payer: BLUE CROSS/BLUE SHIELD | Attending: Internal Medicine | Admitting: Physical Therapy

## 2016-04-09 DIAGNOSIS — M25552 Pain in left hip: Secondary | ICD-10-CM | POA: Diagnosis not present

## 2016-04-09 DIAGNOSIS — R293 Abnormal posture: Secondary | ICD-10-CM | POA: Insufficient documentation

## 2016-04-09 DIAGNOSIS — M545 Low back pain, unspecified: Secondary | ICD-10-CM

## 2016-04-09 DIAGNOSIS — M25551 Pain in right hip: Secondary | ICD-10-CM | POA: Insufficient documentation

## 2016-04-09 NOTE — Patient Instructions (Signed)
  Hamstring Stretch, Reclined (Strap, Doorframe)   Lengthen bottom leg on floor. Extend top leg along edge of doorframe or press foot up into yoga strap. Hold for 30-60 seconds. Repeat 3_ times each leg.    Achilles Tendon Stretch   Stand with hands supported on wall, elbows slightly bent, feet parallel and both heels on floor, front knee bent, back knee straight. Slowly relax back knee until a stretch is felt in achilles tendon. Hold __30-60_ seconds. Repeat with leg positions switched. Repeat with back knee slightly bent.   Stretching: Piriformis (Supine)  Pull right knee toward opposite shoulder. Hold _30-60___ seconds. Relax. Repeat __3__ times per set. Do ____ sets per session. Do _2___ sessions per day.  Solon Palm, PT 04/09/16 12:03 PM [facility] [address] [place], [place], [address] Phone: [phone]   Fax:  [phone]

## 2016-04-09 NOTE — Therapy (Addendum)
[facility] [place], [place], [address] Phone: [phone]   Fax:  [phone]  Physical Therapy Evaluation  Patient Details  Name: Ashlee [name] MRN: [id number] Date of Birth: 1980-10-03 Referring Provider: Leafy Kindle, PA  Encounter Date: 04/09/2016    Past Medical History:  Diagnosis Date  . Anxiety     Past Surgical History:  Procedure Laterality Date  . ABLATION  2012  . CESAREAN SECTION  2007, 2012   x 2   . TONSILLECTOMY  2009  . TUBAL LIGATION  2012    There were no vitals filed for this visit.                                   PT Long Term Goals - 04/09/16 1204      PT LONG TERM GOAL #1   Title I with HEP for stretching to normalize flexibility and posture.   Time 4   Period Weeks   Status New     PT LONG TERM GOAL #2   Title Patient to stand with equal WB on BLE   Time 4   Period Weeks   Status New             Patient will benefit from skilled therapeutic intervention in order to improve the following deficits and impairments:  Decreased activity tolerance, Pain, Impaired flexibility, Postural dysfunction  Visit Diagnosis: Abnormal posture - Plan: PT plan of care cert/re-cert  Bilateral low back pain without sciatica - Plan: PT plan of care cert/re-cert  Pain in left hip - Plan: PT plan of care cert/re-cert  Pain in right hip - Plan: PT plan of care cert/re-cert     Problem List Patient Active Problem List   Diagnosis Date Noted  . Paresthesias 02/14/2014    Madelyn Flavors PT 08/13/2016, 11:19 AM  [facility] [address] [place], [place], [address] Phone: [phone]   Fax:  [phone]  Name: Ashlee [name] MRN: [id number] Date of Birth: 12-01-1980  PHYSICAL THERAPY DISCHARGE SUMMARY  Visits from Start of Care: 1.  Current functional level related to goals / functional outcomes: See  above.   Remaining deficits: See below.   Education / Equipment:  Plan: Patient agrees to discharge.  Patient goals were not met. Patient is being discharged due to not returning since the last visit.  ?????         Mali Applegate MPT

## 2016-04-23 ENCOUNTER — Encounter: Payer: BLUE CROSS/BLUE SHIELD | Admitting: *Deleted

## 2016-05-29 DIAGNOSIS — L820 Inflamed seborrheic keratosis: Secondary | ICD-10-CM | POA: Diagnosis not present

## 2016-06-18 DIAGNOSIS — B349 Viral infection, unspecified: Secondary | ICD-10-CM | POA: Diagnosis not present

## 2016-06-18 DIAGNOSIS — Z6824 Body mass index (BMI) 24.0-24.9, adult: Secondary | ICD-10-CM | POA: Diagnosis not present

## 2016-06-18 DIAGNOSIS — M459 Ankylosing spondylitis of unspecified sites in spine: Secondary | ICD-10-CM | POA: Diagnosis not present

## 2016-06-18 DIAGNOSIS — G510 Bell's palsy: Secondary | ICD-10-CM | POA: Diagnosis not present

## 2016-06-18 DIAGNOSIS — Z1389 Encounter for screening for other disorder: Secondary | ICD-10-CM | POA: Diagnosis not present

## 2016-07-30 DIAGNOSIS — Z79899 Other long term (current) drug therapy: Secondary | ICD-10-CM | POA: Diagnosis not present

## 2016-07-30 DIAGNOSIS — M545 Low back pain: Secondary | ICD-10-CM | POA: Diagnosis not present

## 2016-07-30 DIAGNOSIS — M450 Ankylosing spondylitis of multiple sites in spine: Secondary | ICD-10-CM | POA: Diagnosis not present

## 2016-07-31 DIAGNOSIS — Z79899 Other long term (current) drug therapy: Secondary | ICD-10-CM | POA: Diagnosis not present

## 2016-07-31 DIAGNOSIS — Z6826 Body mass index (BMI) 26.0-26.9, adult: Secondary | ICD-10-CM | POA: Diagnosis not present

## 2016-07-31 DIAGNOSIS — Z1389 Encounter for screening for other disorder: Secondary | ICD-10-CM | POA: Diagnosis not present

## 2016-07-31 DIAGNOSIS — M450 Ankylosing spondylitis of multiple sites in spine: Secondary | ICD-10-CM | POA: Diagnosis not present

## 2016-07-31 DIAGNOSIS — R5383 Other fatigue: Secondary | ICD-10-CM | POA: Diagnosis not present

## 2016-09-10 DIAGNOSIS — E663 Overweight: Secondary | ICD-10-CM | POA: Diagnosis not present

## 2016-09-10 DIAGNOSIS — Z6826 Body mass index (BMI) 26.0-26.9, adult: Secondary | ICD-10-CM | POA: Diagnosis not present

## 2016-09-10 DIAGNOSIS — J019 Acute sinusitis, unspecified: Secondary | ICD-10-CM | POA: Diagnosis not present

## 2016-09-10 DIAGNOSIS — Z1389 Encounter for screening for other disorder: Secondary | ICD-10-CM | POA: Diagnosis not present

## 2016-09-30 DIAGNOSIS — J309 Allergic rhinitis, unspecified: Secondary | ICD-10-CM | POA: Diagnosis not present

## 2016-09-30 DIAGNOSIS — J343 Hypertrophy of nasal turbinates: Secondary | ICD-10-CM | POA: Diagnosis not present

## 2016-09-30 DIAGNOSIS — E663 Overweight: Secondary | ICD-10-CM | POA: Diagnosis not present

## 2016-09-30 DIAGNOSIS — Z1389 Encounter for screening for other disorder: Secondary | ICD-10-CM | POA: Diagnosis not present

## 2016-09-30 DIAGNOSIS — M450 Ankylosing spondylitis of multiple sites in spine: Secondary | ICD-10-CM | POA: Diagnosis not present

## 2016-09-30 DIAGNOSIS — Z6826 Body mass index (BMI) 26.0-26.9, adult: Secondary | ICD-10-CM | POA: Diagnosis not present

## 2016-09-30 DIAGNOSIS — J342 Deviated nasal septum: Secondary | ICD-10-CM | POA: Diagnosis not present

## 2016-09-30 DIAGNOSIS — J329 Chronic sinusitis, unspecified: Secondary | ICD-10-CM | POA: Diagnosis not present

## 2016-10-07 DIAGNOSIS — J329 Chronic sinusitis, unspecified: Secondary | ICD-10-CM | POA: Diagnosis not present

## 2016-10-17 DIAGNOSIS — R0981 Nasal congestion: Secondary | ICD-10-CM | POA: Diagnosis not present

## 2016-10-17 DIAGNOSIS — J342 Deviated nasal septum: Secondary | ICD-10-CM | POA: Diagnosis not present

## 2016-10-17 DIAGNOSIS — J343 Hypertrophy of nasal turbinates: Secondary | ICD-10-CM | POA: Diagnosis not present

## 2016-10-17 DIAGNOSIS — J309 Allergic rhinitis, unspecified: Secondary | ICD-10-CM | POA: Diagnosis not present

## 2016-12-04 DIAGNOSIS — J3089 Other allergic rhinitis: Secondary | ICD-10-CM | POA: Diagnosis not present

## 2016-12-04 DIAGNOSIS — J3081 Allergic rhinitis due to animal (cat) (dog) hair and dander: Secondary | ICD-10-CM | POA: Diagnosis not present

## 2016-12-04 DIAGNOSIS — Z91012 Allergy to eggs: Secondary | ICD-10-CM | POA: Diagnosis not present

## 2016-12-04 DIAGNOSIS — Z91018 Allergy to other foods: Secondary | ICD-10-CM | POA: Diagnosis not present

## 2016-12-04 DIAGNOSIS — J301 Allergic rhinitis due to pollen: Secondary | ICD-10-CM | POA: Diagnosis not present

## 2016-12-06 DIAGNOSIS — J342 Deviated nasal septum: Secondary | ICD-10-CM | POA: Diagnosis not present

## 2016-12-06 DIAGNOSIS — J343 Hypertrophy of nasal turbinates: Secondary | ICD-10-CM | POA: Diagnosis not present

## 2016-12-06 DIAGNOSIS — R0981 Nasal congestion: Secondary | ICD-10-CM | POA: Diagnosis not present

## 2016-12-06 DIAGNOSIS — J309 Allergic rhinitis, unspecified: Secondary | ICD-10-CM | POA: Diagnosis not present

## 2016-12-23 DIAGNOSIS — J3089 Other allergic rhinitis: Secondary | ICD-10-CM | POA: Diagnosis not present

## 2016-12-26 DIAGNOSIS — J3089 Other allergic rhinitis: Secondary | ICD-10-CM | POA: Diagnosis not present

## 2016-12-30 DIAGNOSIS — J3089 Other allergic rhinitis: Secondary | ICD-10-CM | POA: Diagnosis not present

## 2017-01-02 DIAGNOSIS — J3089 Other allergic rhinitis: Secondary | ICD-10-CM | POA: Diagnosis not present

## 2017-01-06 DIAGNOSIS — J329 Chronic sinusitis, unspecified: Secondary | ICD-10-CM | POA: Diagnosis not present

## 2017-01-09 DIAGNOSIS — J329 Chronic sinusitis, unspecified: Secondary | ICD-10-CM | POA: Diagnosis not present

## 2017-01-13 DIAGNOSIS — J329 Chronic sinusitis, unspecified: Secondary | ICD-10-CM | POA: Diagnosis not present

## 2017-01-16 DIAGNOSIS — J329 Chronic sinusitis, unspecified: Secondary | ICD-10-CM | POA: Diagnosis not present

## 2017-01-20 DIAGNOSIS — J329 Chronic sinusitis, unspecified: Secondary | ICD-10-CM | POA: Diagnosis not present

## 2017-01-23 DIAGNOSIS — J309 Allergic rhinitis, unspecified: Secondary | ICD-10-CM | POA: Diagnosis not present

## 2017-01-27 DIAGNOSIS — J329 Chronic sinusitis, unspecified: Secondary | ICD-10-CM | POA: Diagnosis not present

## 2017-01-30 DIAGNOSIS — J329 Chronic sinusitis, unspecified: Secondary | ICD-10-CM | POA: Diagnosis not present

## 2017-02-03 DIAGNOSIS — J329 Chronic sinusitis, unspecified: Secondary | ICD-10-CM | POA: Diagnosis not present

## 2017-02-07 DIAGNOSIS — M450 Ankylosing spondylitis of multiple sites in spine: Secondary | ICD-10-CM | POA: Diagnosis not present

## 2017-02-07 DIAGNOSIS — Z79899 Other long term (current) drug therapy: Secondary | ICD-10-CM | POA: Diagnosis not present

## 2017-02-07 DIAGNOSIS — M545 Low back pain: Secondary | ICD-10-CM | POA: Diagnosis not present

## 2017-02-12 DIAGNOSIS — J309 Allergic rhinitis, unspecified: Secondary | ICD-10-CM | POA: Diagnosis not present

## 2017-02-14 DIAGNOSIS — J309 Allergic rhinitis, unspecified: Secondary | ICD-10-CM | POA: Diagnosis not present

## 2017-02-17 DIAGNOSIS — J329 Chronic sinusitis, unspecified: Secondary | ICD-10-CM | POA: Diagnosis not present

## 2017-02-20 DIAGNOSIS — J329 Chronic sinusitis, unspecified: Secondary | ICD-10-CM | POA: Diagnosis not present

## 2017-03-03 DIAGNOSIS — J329 Chronic sinusitis, unspecified: Secondary | ICD-10-CM | POA: Diagnosis not present

## 2017-03-06 DIAGNOSIS — J309 Allergic rhinitis, unspecified: Secondary | ICD-10-CM | POA: Diagnosis not present

## 2017-03-10 DIAGNOSIS — J329 Chronic sinusitis, unspecified: Secondary | ICD-10-CM | POA: Diagnosis not present

## 2017-03-12 DIAGNOSIS — J328 Other chronic sinusitis: Secondary | ICD-10-CM | POA: Diagnosis not present

## 2017-03-16 DIAGNOSIS — I201 Angina pectoris with documented spasm: Secondary | ICD-10-CM

## 2017-03-16 HISTORY — DX: Angina pectoris with documented spasm: I20.1

## 2017-03-16 HISTORY — PX: OTHER SURGICAL HISTORY: SHX169

## 2017-03-17 DIAGNOSIS — J329 Chronic sinusitis, unspecified: Secondary | ICD-10-CM | POA: Diagnosis not present

## 2017-03-20 DIAGNOSIS — J329 Chronic sinusitis, unspecified: Secondary | ICD-10-CM | POA: Diagnosis not present

## 2017-03-21 DIAGNOSIS — J342 Deviated nasal septum: Secondary | ICD-10-CM | POA: Diagnosis not present

## 2017-03-21 DIAGNOSIS — Z79899 Other long term (current) drug therapy: Secondary | ICD-10-CM | POA: Diagnosis not present

## 2017-03-21 DIAGNOSIS — J309 Allergic rhinitis, unspecified: Secondary | ICD-10-CM | POA: Diagnosis not present

## 2017-03-21 DIAGNOSIS — J343 Hypertrophy of nasal turbinates: Secondary | ICD-10-CM | POA: Diagnosis not present

## 2017-03-21 DIAGNOSIS — Z720 Tobacco use: Secondary | ICD-10-CM | POA: Diagnosis not present

## 2017-03-21 DIAGNOSIS — R0981 Nasal congestion: Secondary | ICD-10-CM | POA: Diagnosis not present

## 2017-03-26 ENCOUNTER — Encounter (HOSPITAL_COMMUNITY): Payer: Self-pay | Admitting: *Deleted

## 2017-03-26 ENCOUNTER — Emergency Department (HOSPITAL_COMMUNITY)
Admission: EM | Admit: 2017-03-26 | Discharge: 2017-03-26 | Disposition: A | Discharge status: Home / Self Care | Payer: BLUE CROSS/BLUE SHIELD | Attending: Emergency Medicine | Admitting: Emergency Medicine

## 2017-03-26 ENCOUNTER — Emergency Department (HOSPITAL_COMMUNITY): Payer: BLUE CROSS/BLUE SHIELD

## 2017-03-26 DIAGNOSIS — Z87891 Personal history of nicotine dependence: Secondary | ICD-10-CM | POA: Diagnosis not present

## 2017-03-26 DIAGNOSIS — R110 Nausea: Secondary | ICD-10-CM | POA: Diagnosis not present

## 2017-03-26 DIAGNOSIS — R102 Pelvic and perineal pain: Secondary | ICD-10-CM | POA: Diagnosis not present

## 2017-03-26 DIAGNOSIS — Z79899 Other long term (current) drug therapy: Secondary | ICD-10-CM | POA: Diagnosis not present

## 2017-03-26 DIAGNOSIS — R1031 Right lower quadrant pain: Secondary | ICD-10-CM | POA: Diagnosis not present

## 2017-03-26 DIAGNOSIS — R1030 Lower abdominal pain, unspecified: Secondary | ICD-10-CM | POA: Insufficient documentation

## 2017-03-26 DIAGNOSIS — R100 Acute abdomen: Secondary | ICD-10-CM | POA: Diagnosis not present

## 2017-03-26 DIAGNOSIS — F419 Anxiety disorder, unspecified: Secondary | ICD-10-CM | POA: Diagnosis not present

## 2017-03-26 DIAGNOSIS — R079 Chest pain, unspecified: Secondary | ICD-10-CM | POA: Diagnosis not present

## 2017-03-26 LAB — CBC WITH DIFFERENTIAL/PLATELET
Basophils Absolute: 0 10*3/uL (ref 0.0–0.1)
Basophils Relative: 0 %
Eosinophils Absolute: 1.9 10*3/uL — ABNORMAL HIGH (ref 0.0–0.7)
Eosinophils Relative: 19 %
HEMATOCRIT: 40.5 % (ref 36.0–46.0)
HEMOGLOBIN: 13.6 g/dL (ref 12.0–15.0)
LYMPHS ABS: 3.7 10*3/uL (ref 0.7–4.0)
Lymphocytes Relative: 37 %
MCH: 31.3 pg (ref 26.0–34.0)
MCHC: 33.6 g/dL (ref 30.0–36.0)
MCV: 93.3 fL (ref 78.0–100.0)
Monocytes Absolute: 0.5 10*3/uL (ref 0.1–1.0)
Monocytes Relative: 5 %
NEUTROS ABS: 4 10*3/uL (ref 1.7–7.7)
NEUTROS PCT: 39 %
Platelets: 261 10*3/uL (ref 150–400)
RBC: 4.34 MIL/uL (ref 3.87–5.11)
RDW: 12.4 % (ref 11.5–15.5)
WBC: 10.2 10*3/uL (ref 4.0–10.5)

## 2017-03-26 LAB — COMPREHENSIVE METABOLIC PANEL
ALK PHOS: 39 U/L (ref 38–126)
ALT: 25 U/L (ref 14–54)
AST: 24 U/L (ref 15–41)
Albumin: 4.1 g/dL (ref 3.5–5.0)
Anion gap: 8 (ref 5–15)
BUN: 7 mg/dL (ref 6–20)
CALCIUM: 9.4 mg/dL (ref 8.9–10.3)
CO2: 28 mmol/L (ref 22–32)
CREATININE: 0.83 mg/dL (ref 0.44–1.00)
Chloride: 102 mmol/L (ref 101–111)
Glucose, Bld: 97 mg/dL (ref 65–99)
Potassium: 4 mmol/L (ref 3.5–5.1)
Sodium: 138 mmol/L (ref 135–145)
Total Bilirubin: 0.5 mg/dL (ref 0.3–1.2)
Total Protein: 7.3 g/dL (ref 6.5–8.1)

## 2017-03-26 LAB — URINALYSIS, ROUTINE W REFLEX MICROSCOPIC
Bilirubin Urine: NEGATIVE
GLUCOSE, UA: NEGATIVE mg/dL
HGB URINE DIPSTICK: NEGATIVE
Ketones, ur: NEGATIVE mg/dL
Leukocytes, UA: NEGATIVE
NITRITE: NEGATIVE
Protein, ur: NEGATIVE mg/dL
SPECIFIC GRAVITY, URINE: 1.011 (ref 1.005–1.030)
pH: 8 (ref 5.0–8.0)

## 2017-03-26 LAB — TROPONIN I

## 2017-03-26 LAB — D-DIMER, QUANTITATIVE: D-Dimer, Quant: 0.4 ug/mL-FEU (ref 0.00–0.50)

## 2017-03-26 LAB — PREGNANCY, URINE: Preg Test, Ur: NEGATIVE

## 2017-03-26 MED ORDER — ONDANSETRON HCL 4 MG/2ML IJ SOLN
4.0000 mg | Freq: Once | INTRAMUSCULAR | Status: AC
  Administered 2017-03-26: 4 mg via INTRAVENOUS
  Filled 2017-03-26: qty 2

## 2017-03-26 MED ORDER — MORPHINE SULFATE (PF) 4 MG/ML IV SOLN
4.0000 mg | Freq: Once | INTRAVENOUS | Status: AC
  Administered 2017-03-26: 4 mg via INTRAVENOUS
  Filled 2017-03-26: qty 1

## 2017-03-26 NOTE — ED Notes (Signed)
Pt declines pelvic exam

## 2017-03-26 NOTE — ED Notes (Signed)
Pt states that she had the packing removed from her nose yesterday from her nasal sx on the previous Friday she states that she was coughing up clots yesterday but none this morning. No bleeding noted she did have some dried blood inside her right nare.

## 2017-03-26 NOTE — ED Notes (Signed)
unsuccessful on 2 attempts one in right ac and right wrist .

## 2017-03-26 NOTE — ED Provider Notes (Signed)
[facility]-EMERGENCY DEPT Provider Note   CSN: [id number] Arrival date & time: 03/26/17  0550     History   Chief Complaint Chief Complaint  Patient presents with  . Chest Pain    HPI Ashlee [name] is a 36 y.o. female.  HPI  This is a 36 year old female with a history of anxiety and recent nasal septum repair who presents with chest pain and abdominal pain. Patient reports that she woke up with anterior pressure-like chest pain. She's never had anything like this before. She denies shortness of breath. She additionally reports lower crampy abdominal pain. She reports nausea, no vomiting or diarrhea. She denies cough or fever. She denies pleuritic component to her pain. She states that she noted bilateral upper extremity numbness in the hands. Currently her abdominal pain is worse and 8 out of 10. Denies dysuria or flank pain.  Past Medical History:  Diagnosis Date  . Anxiety     Patient Active Problem List   Diagnosis Date Noted  . Paresthesias 02/14/2014    Past Surgical History:  Procedure Laterality Date  . ABLATION  2012  . CESAREAN SECTION  2007, 2012   x 2   . NOSE SURGERY    . TONSILLECTOMY  2009  . TUBAL LIGATION  2012    OB History    No data available       Home Medications    Prior to Admission medications   Medication Sig Start Date End Date Taking? Authorizing Provider  cetirizine (ZYRTEC) 10 MG tablet Take 10 mg by mouth as needed for allergies.   Yes [provider]  HYDROcodone-acetaminophen (NORCO/VICODIN) 5-325 MG per tablet Take 1 tablet by mouth every 6 (six) hours as needed for moderate pain. 07/14/14  Yes Bethann BerkshireZammit, Joseph, MD  ibuprofen (ADVIL,MOTRIN) 200 MG tablet Take 200 mg by mouth 3 (three) times daily.   Yes [provider]  cyclobenzaprine (FLEXERIL) 5 MG tablet Take 5 mg by mouth as needed for muscle spasms.    [provider]    Family History No family history on file.  Social History Social History    Substance Use Topics  . Smoking status: Former Smoker    Packs/day: 1.00    Types: E-cigarettes    Quit date: 03/27/2015  . Smokeless tobacco: Never Used     Comment: Quit in 07/2013  . Alcohol use Yes     Comment: occasionally     Allergies   Wellbutrin [bupropion]   Review of Systems Review of Systems  Constitutional: Negative for fever.  Respiratory: Negative for cough and shortness of breath.   Cardiovascular: Positive for chest pain.  Gastrointestinal: Positive for abdominal pain and nausea. Negative for vomiting.  Genitourinary: Negative for dysuria.  All other systems reviewed and are negative.    Physical Exam Updated Vital Signs BP 111/69 (BP Location: Left Arm)   Pulse 76   Temp (!) 97.5 F (36.4 C) (Oral)   Resp 17   Ht 5\' 4"  (1.626 m)   Wt 65.8 kg (145 lb)   SpO2 100%   BMI 24.89 kg/m   Physical Exam  Constitutional: She is oriented to person, place, and time. She appears well-developed and well-nourished.  Anxious appearing, shaking  HENT:  Head: Normocephalic and atraumatic.  Cardiovascular: Normal rate, regular rhythm and normal heart sounds.   No murmur heard. Pulmonary/Chest: Effort normal and breath sounds normal. No respiratory distress. She has no wheezes.  Abdominal: Soft. Bowel sounds are normal. She exhibits  no mass. There is tenderness. There is no guarding.  Lower abdominal and suprapubic tenderness palpation, no rebound or guarding  Musculoskeletal: She exhibits no edema.  Neurological: She is alert and oriented to person, place, and time.  Skin: Skin is warm and dry.  Psychiatric: She has a normal mood and affect.  Nursing note and vitals reviewed.    ED Treatments / Results  Labs (all labs ordered are listed, but only abnormal results are displayed) Labs Reviewed  CBC WITH DIFFERENTIAL/PLATELET - Abnormal; Notable for the following:       Result Value   Eosinophils Absolute 1.9 (*)    All other components within normal  limits  URINALYSIS, ROUTINE W REFLEX MICROSCOPIC - Abnormal; Notable for the following:    Bacteria, UA RARE (*)    Squamous Epithelial / LPF 0-5 (*)    All other components within normal limits  D-DIMER, QUANTITATIVE (NOT AT [facility])  COMPREHENSIVE METABOLIC PANEL  TROPONIN I  PREGNANCY, URINE    EKG  EKG Interpretation  Date/Time:  Wednesday March 26 2017 06:05:00 EDT Ventricular Rate:  75 PR Interval:    QRS Duration: 87 QT Interval:  374 QTC Calculation: 418 R Axis:   77 Text Interpretation:  Sinus rhythm No prior for comparison Confirmed by Ross Marcus ([id number]) on 03/26/2017 6:26:51 AM       Radiology No results found.  Procedures Procedures (including critical care time)  Medications Ordered in ED Medications  morphine 4 MG/ML injection 4 mg (4 mg Intravenous Given 03/26/17 0618)  ondansetron (ZOFRAN) injection 4 mg (4 mg Intravenous Given 03/26/17 0618)     Initial Impression / Assessment and Plan / ED Course  I have reviewed the triage vital signs and the nursing notes.  Pertinent labs & imaging results that were available during my care of the patient were reviewed by me and considered in my medical decision making (see chart for details).     Patient presents with abdominal and chest pain. Currently she is complaining mostly of abdominal pain. Chest pain has resolved. She's anxious appearing but nontoxic. Vital signs are reassuring. EKG is nonischemic. Troponin and d-dimer negative. Chest x-ray and acute abdominal series reassuring. Lab work is fairly nondiagnostic. Discussed further interventions with patient's including pelvic exam and pelvic imaging giving persistent abdominal pain following medications. Patient refusing pelvic examination. She does have history of ovarian cyst. Will obtain abdominal ultrasound to evaluate.  Signed out to Dr. Aileen Pilot.  Final Clinical Impressions(s) / ED Diagnoses   Final diagnoses:  None    New Prescriptions New  Prescriptions   No medications on file     Shon Baton, MD 03/26/17 2310

## 2017-03-26 NOTE — Discharge Instructions (Signed)
Drink plenty of fluids and rest today.  Follow up with your md next week for recheck °

## 2017-03-26 NOTE — ED Triage Notes (Signed)
Pt arrived by EMS. Pt states she woke tonight w/ chest & abdominal pain.

## 2017-03-29 ENCOUNTER — Encounter (HOSPITAL_COMMUNITY): Payer: Self-pay | Admitting: Emergency Medicine

## 2017-03-29 ENCOUNTER — Emergency Department (HOSPITAL_COMMUNITY)
Admission: EM | Admit: 2017-03-29 | Discharge: 2017-03-29 | Disposition: A | Discharge status: Home / Self Care | Payer: BLUE CROSS/BLUE SHIELD | Attending: Emergency Medicine | Admitting: Emergency Medicine

## 2017-03-29 ENCOUNTER — Emergency Department (HOSPITAL_COMMUNITY): Payer: BLUE CROSS/BLUE SHIELD

## 2017-03-29 ENCOUNTER — Other Ambulatory Visit: Payer: Self-pay

## 2017-03-29 DIAGNOSIS — Z791 Long term (current) use of non-steroidal anti-inflammatories (NSAID): Secondary | ICD-10-CM | POA: Insufficient documentation

## 2017-03-29 DIAGNOSIS — Z7983 Long term (current) use of bisphosphonates: Secondary | ICD-10-CM | POA: Insufficient documentation

## 2017-03-29 DIAGNOSIS — R0789 Other chest pain: Secondary | ICD-10-CM | POA: Diagnosis not present

## 2017-03-29 DIAGNOSIS — R072 Precordial pain: Secondary | ICD-10-CM

## 2017-03-29 DIAGNOSIS — R079 Chest pain, unspecified: Secondary | ICD-10-CM | POA: Diagnosis not present

## 2017-03-29 DIAGNOSIS — Z87891 Personal history of nicotine dependence: Secondary | ICD-10-CM | POA: Diagnosis not present

## 2017-03-29 DIAGNOSIS — Z79899 Other long term (current) drug therapy: Secondary | ICD-10-CM | POA: Insufficient documentation

## 2017-03-29 LAB — BASIC METABOLIC PANEL
Anion gap: 10 (ref 5–15)
BUN: 7 mg/dL (ref 6–20)
CHLORIDE: 106 mmol/L (ref 101–111)
CO2: 24 mmol/L (ref 22–32)
CREATININE: 0.72 mg/dL (ref 0.44–1.00)
Calcium: 9.4 mg/dL (ref 8.9–10.3)
Glucose, Bld: 95 mg/dL (ref 65–99)
POTASSIUM: 3.8 mmol/L (ref 3.5–5.1)
SODIUM: 140 mmol/L (ref 135–145)

## 2017-03-29 LAB — I-STAT BETA HCG BLOOD, ED (MC, WL, AP ONLY)

## 2017-03-29 LAB — CBC
HCT: 40.1 % (ref 36.0–46.0)
Hemoglobin: 13.6 g/dL (ref 12.0–15.0)
MCH: 31.6 pg (ref 26.0–34.0)
MCHC: 33.9 g/dL (ref 30.0–36.0)
MCV: 93.3 fL (ref 78.0–100.0)
PLATELETS: 250 10*3/uL (ref 150–400)
RBC: 4.3 MIL/uL (ref 3.87–5.11)
RDW: 12.7 % (ref 11.5–15.5)
WBC: 8.5 10*3/uL (ref 4.0–10.5)

## 2017-03-29 LAB — HEPATIC FUNCTION PANEL
ALT: 23 U/L (ref 14–54)
AST: 26 U/L (ref 15–41)
Albumin: 4.4 g/dL (ref 3.5–5.0)
Alkaline Phosphatase: 42 U/L (ref 38–126)
BILIRUBIN TOTAL: 0.6 mg/dL (ref 0.3–1.2)
Bilirubin, Direct: <0.1 mg/dL — ABNORMAL LOW (ref 0.1–0.5)
Total Protein: 7.4 g/dL (ref 6.5–8.1)

## 2017-03-29 LAB — I-STAT TROPONIN, ED: TROPONIN I, POC: 0 ng/mL (ref 0.00–0.08)

## 2017-03-29 NOTE — ED Triage Notes (Signed)
Pt states last week she had nose surgery and the same day started having chest pressure. For the last couple months pt states "i just haven't felt great, exhausted and tired" Pt states she went to [facility] for chest pain, was evaluated there without explanation for her chest pain. Pt states "I just know my body and I know something is wrong." Pt still having ongoing chest pressure and nausea.

## 2017-03-29 NOTE — ED Provider Notes (Signed)
[facility]-EMERGENCY DEPT Provider Note   CSN: [id number] Arrival date & time: 03/29/17  0758     History   Chief Complaint Chief Complaint  Patient presents with  . Chest Pain    HPI Ashlee [name] is a 36 y.o. female.  The history is provided by the patient and a relative. No language interpreter was used.  Chest Pain      Ashlee [name] is a 36 y.o. female who presents to the Emergency Department complaining of chest pain.  She presents for evaluation of several months of progressive intermittent chest pressure. She reports symptoms that wax and wane and last for about 10-15 minutes. Episodes of chest pressure are associated with nausea, shortness of breath, diaphoresis and some vague abdominal discomfort. On Wednesday she had a severe episode that awoke her first from sleep with severe diaphoresis. She was evaluated in the emergency department and discharged home. She reports that her symptoms have been persistent since that time with waxing and waning chest discomfort but she has not experienced a severe diaphoresis. She is very concerned that this could be related to heart disease and she comes in today seeking more testing. She has a history of ankylosing spondylitis and takes Humira. She had nasal septal surgery one week ago without complications. She reports progressive fatigue. She has a history of smoking and a family history of early cardiac disease in women. At time she has intermittent left arm pain as well as tingling in bilateral hands. No leg swelling or pain. She does not take any hormone medications. She has no history of blood clots.  Past Medical History:  Diagnosis Date  . Anxiety     Patient Active Problem List   Diagnosis Date Noted  . Paresthesias 02/14/2014    Past Surgical History:  Procedure Laterality Date  . ABLATION  2012  . CESAREAN SECTION  2007, 2012   x 2   . NOSE SURGERY    . TONSILLECTOMY  2009  . TUBAL LIGATION  2012    OB History    No data  available       Home Medications    Prior to Admission medications   Medication Sig Start Date End Date Taking? Authorizing Provider  cephALEXin (KEFLEX) 500 MG capsule Take 500 mg by mouth 3 (three) times daily.  03/11/17   [provider]  cetirizine (ZYRTEC) 10 MG tablet Take 10 mg by mouth as needed for allergies.    [provider]  cyclobenzaprine (FLEXERIL) 5 MG tablet Take 5 mg by mouth as needed for muscle spasms.    [provider]  fluconazole (DIFLUCAN) 200 MG tablet TAKE 1 TABLET BY MOUTH TWICE A WEEK 02/25/17   [provider]  HUMIRA PEN 40 MG/0.8ML PNKT Place 40 mg onto the skin every 14 (fourteen) days.  03/10/17   [provider]  HYDROcodone-acetaminophen (NORCO/VICODIN) 5-325 MG per tablet Take 1 tablet by mouth every 6 (six) hours as needed for moderate pain. 07/14/14   Bethann Berkshire, MD  ibuprofen (ADVIL,MOTRIN) 200 MG tablet Take 400-800 mg by mouth every 8 (eight) hours as needed for mild pain or moderate pain.     [provider]  ondansetron (ZOFRAN-ODT) 4 MG disintegrating tablet Take 4 mg by mouth every 8 (eight) hours as needed for nausea or vomiting.  03/11/17   [provider]    Family History No family history on file.  Social History Social History  Substance Use Topics  .  Smoking status: Former Smoker    Packs/day: 1.00    Types: E-cigarettes    Quit date: 03/27/2015  . Smokeless tobacco: Never Used     Comment: Quit in 07/2013  . Alcohol use Yes     Comment: occasionally     Allergies   Wellbutrin [bupropion]   Review of Systems Review of Systems  Cardiovascular: Positive for chest pain.  All other systems reviewed and are negative.    Physical Exam Updated Vital Signs BP 111/70   Pulse 100   Temp 98.5 F (36.9 C) (Oral)   Resp 20   SpO2 100%   Physical Exam  Constitutional: She is oriented to person, place, and time. She appears well-developed and well-nourished.    HENT:  Head: Normocephalic and atraumatic.  Cardiovascular: Normal rate and regular rhythm.   No murmur heard. Pulmonary/Chest: Effort normal and breath sounds normal. No respiratory distress.  Abdominal: Soft. There is no tenderness. There is no rebound and no guarding.  Musculoskeletal: She exhibits no edema or tenderness.  Neurological: She is alert and oriented to person, place, and time.  Skin: Skin is warm and dry.  Psychiatric: She has a normal mood and affect. Her behavior is normal.  Nursing note and vitals reviewed.    ED Treatments / Results  Labs (all labs ordered are listed, but only abnormal results are displayed) Labs Reviewed  BASIC METABOLIC PANEL  CBC  HEPATIC FUNCTION PANEL  I-STAT TROPOININ, ED  I-STAT BETA HCG BLOOD, ED ([facility], [facility], [facility] ONLY)    EKG  EKG Interpretation  Date/Time:  Saturday March 29 2017 08:08:23 EDT Ventricular Rate:  101 PR Interval:  128 QRS Duration: 88 QT Interval:  332 QTC Calculation: 430 R Axis:   73 Text Interpretation:  Sinus tachycardia Otherwise normal ECG Confirmed by Lincoln Brighamees, Liz [phone]([id number]) on 03/29/2017 10:01:31 AM       Radiology Dg Chest 2 View  Result Date: 03/29/2017 CLINICAL DATA:  For the last couple months, pt states "i just haven't felt great, exhausted and tired". Pt states she went to [facility] for substernal chest pain on 7/11, was evaluated there without explanation for her chest pain. Pt states "I just know my body and I know something is wrong." Pt still having ongoing chest pressure and nausea intermittently. Smoker. She reports being dx with an autoimmune disorder approx. 1 year ago EXAM: CHEST  2 VIEW COMPARISON:  03/26/2017 FINDINGS: The heart size and mediastinal contours are within normal limits. Both lungs are clear. No pleural effusion or pneumothorax. The visualized skeletal structures are unremarkable. IMPRESSION: Normal chest radiographs. Electronically Signed   By: Amie Portlandavid  Ormond M.D.   On:  03/29/2017 08:58    Procedures Procedures (including critical care time)  Medications Ordered in ED Medications - No data to display   Initial Impression / Assessment and Plan / ED Course  I have reviewed the triage vital signs and the nursing notes.  Pertinent labs & imaging results that were available during my care of the patient were reviewed by me and considered in my medical decision making (see chart for details).     Patient with history of ankylosing spondylitis here for evaluation of episodic chest pain for the last several months. She is in no acute distress in the emergency department. Unclear source of symptoms at this time. Patient had evaluation the emergency department 2 days ago with troponins negative 2. EKG today with no acute ischemic changes and troponin is negative. Presentation is  not consistent with referred biliary pain. Plan to DC home with close cardiology follow-up for further outpatient testing.  Final Clinical Impressions(s) / ED Diagnoses   Final diagnoses:  Precordial pain    New Prescriptions New Prescriptions   No medications on file     Tilden Fossa, MD 03/29/17 [phone]

## 2017-03-29 NOTE — ED Notes (Signed)
Pt ambulated to the restroom.

## 2017-03-31 DIAGNOSIS — J329 Chronic sinusitis, unspecified: Secondary | ICD-10-CM | POA: Diagnosis not present

## 2017-04-01 ENCOUNTER — Encounter: Payer: Self-pay | Admitting: Cardiology

## 2017-04-01 ENCOUNTER — Ambulatory Visit (INDEPENDENT_AMBULATORY_CARE_PROVIDER_SITE_OTHER): Payer: BLUE CROSS/BLUE SHIELD | Admitting: Cardiology

## 2017-04-01 VITALS — BP 107/73 | HR 93 | Ht 64.0 in | Wt 146.0 lb

## 2017-04-01 DIAGNOSIS — Z8249 Family history of ischemic heart disease and other diseases of the circulatory system: Secondary | ICD-10-CM | POA: Insufficient documentation

## 2017-04-01 DIAGNOSIS — R079 Chest pain, unspecified: Secondary | ICD-10-CM

## 2017-04-01 MED ORDER — NITROGLYCERIN 0.4 MG SL SUBL: 0.4000 mg | SUBLINGUAL_TABLET | SUBLINGUAL | 3 refills | Status: DC | PRN

## 2017-04-01 MED ORDER — ISOSORBIDE MONONITRATE ER 30 MG PO TB24: 30.0000 mg | ORAL_TABLET | Freq: Every day | ORAL | 3 refills | Status: DC

## 2017-04-01 NOTE — Progress Notes (Signed)
PCP: [facility]  Clinic Note: Chief Complaint  Patient presents with  . New Evaluation    chest pain for about 8 weeks, went to [facility]     HPI: Ashlee [name] is a 36 y.o. female with a PMHMost of her ankylosing spondylitis for which she takes Humira. She presents today for Emergency room follow-up for chest pain. She has a family history of premature coronary disease history.  Ashlee [name] was seen on both July 11 and July 14 emergency room and [facility]. First time was with lower abdominal pain, second time was noted to have chest pain symptoms lasting off and on 10-15 minutes. Associated with nausea, shortness of breath and dyspnea, as well as vague abdominal discomfort.  After initial episode where she went to emergency room with severe shortness of breath and diaphoresis associated with abdominal pain, she is not having persistent waxing and waning discomfort not the severe extent.  Studies Personally Reviewed - (if available, images/films reviewed: From Epic Chart or Care Everywhere)  n/a  Interval History: Ashlee [name] presents today to discuss several episodes of chest discomfort. Most notable episode was on July 11 she went to [facility] having awoken with severe intense chest pressure that was associated with nausea and dyspnea. She was unable to throw up when she got up to go to the bathroom, and sat on the floor crying after her husband for help. They called 911. He indicated she was diaphoretic, pale with slow breathing and lethargy. Both arms were numb. She thought she was having heart attack. She has poor recollection of the event the time the cold 911. She's never had an episode like that before, and has not had another episode to that extent. She went to [facility] emergency room on the 14th by POV because she was still having chest discomfort and nausea as well as sweating. For about 2 months leading up to the episode the 11th, she had been  noticing intermittent chest pressure with nausea and sweating lasting maybe 10 minutes. These would come and go associated with flushing.  Episodes are not necessarily associated with any particular activity. Often occurring at rest. She is also noted fatigue and a complete sense of exhaustion. She is also had left arm pain, not necessarily associated with the chest pain and nausea. Her on not feeling well. Poor energy level. Has had occasional episodes of lightheadedness where she felt like she may pass out but has not had true syncope.  Reportedly, none of these episodes are associated with any particular activity. She is able to routine activity without having chest pain or pressure. No dyspnea is certainly a day with exertion.  No PND, orthopnea or edema. She has not had any palpitations or syncope. No TIA or amaurosis fugax symptoms.  No melena, hematochezia, hematuria, or epstaxis. No claudication.  ROS: A comprehensive was performed. Review of Systems  Constitutional: Positive for diaphoresis and malaise/fatigue.  HENT: Negative for congestion.   Respiratory: Negative for cough, shortness of breath and wheezing.   Cardiovascular:       Per history of present illness  Gastrointestinal: Positive for nausea. Negative for blood in stool, constipation, diarrhea, melena and vomiting.  Genitourinary: Negative for hematuria and urgency.  Musculoskeletal: Positive for back pain (Nothing significant from her baseline.). Negative for falls.  Neurological: Positive for dizziness. Negative for focal weakness.  Psychiatric/Behavioral: Negative for depression and memory loss. The patient is nervous/anxious.   All other systems  reviewed and are negative.  I have reviewed and (if needed) personally updated the patient's problem list, medications, allergies, past medical and surgical history, social and family history.   Past Medical History:  Diagnosis Date  . Ankylosing spondylitis (HCC)      Past Surgical History:  Procedure Laterality Date  . ABLATION  2012  . CESAREAN SECTION  2007, 2012   x 2   . NOSE SURGERY    . TONSILLECTOMY  2009  . TUBAL LIGATION  2012    Current Meds  Medication Sig  . aspirin EC 81 MG tablet Take 81 mg by mouth daily.  . cetirizine (ZYRTEC) 10 MG tablet Take 10 mg by mouth as needed for allergies.  Marland Kitchen. HUMIRA PEN 40 MG/0.8ML PNKT Place 40 mg onto the skin every 14 (fourteen) days.   Marland Kitchen. ibuprofen (ADVIL,MOTRIN) 200 MG tablet Take 400-800 mg by mouth every 8 (eight) hours as needed for mild pain or moderate pain.   . Multiple Vitamin (MULTIVITAMIN) capsule Take 1 capsule by mouth daily.    Allergies  Allergen Reactions  . Wellbutrin [Bupropion] Hives    Social History   Social History  . Marital status: Married    Spouse name: N/A  . Number of children: 3  . Years of education: N/A   Occupational History  . office manager     [employer]   Social History Main Topics  . Smoking status: Current Every Day Smoker    Packs/day: 1.00    Types: E-cigarettes    Last attempt to quit: 03/27/2015  . Smokeless tobacco: Never Used     Comment: Quit in 07/2013  . Alcohol use Yes     Comment: occasionally  . Drug use: No  . Sexual activity: Yes    Birth control/ protection: Surgical   Other Topics Concern  . None   Social History Narrative   Married with 3 children   Right handed   12 th grade education   1 cup coffee daily   Work: Technical brewerController for [employer]          family history includes Ankylosing spondylitis in her maternal grandmother; Arrhythmia in her paternal grandmother; Cancer in her sister; Cardiomyopathy (age of onset: 2245) in her maternal aunt; Diabetes in her paternal grandfather; Heart failure in her maternal grandmother; Hypertension in her father; Kidney cancer in her paternal grandfather; Skin cancer in her maternal grandfather and sister.  Wt Readings from Last 3 Encounters:  04/01/17 146 lb  (66.2 kg)  03/26/17 145 lb (65.8 kg)  07/14/14 146 lb (66.2 kg)    PHYSICAL EXAM BP 107/73 (BP Location: Left Arm, Patient Position: Sitting)   Pulse 93   Ht 5\' 4"  (1.626 m)   Wt 146 lb (66.2 kg)   BMI 25.06 kg/m  General appearance: alert, cooperative, appears stated age, no distress. Healthy-appearing, well-nourished and well-groomed. HEENT: /AT, EOMI, MMM, anicteric sclera Neck: no adenopathy, no carotid bruit and no JVD Lungs: clear to auscultation bilaterally, normal percussion bilaterally and non-labored Heart: regular rate and rhythm, S1 &S2 normal, no murmur, click, rub or gallop; nondisplaced PMI Abdomen: soft, non-tender; bowel sounds normal; no masses,  no organomegaly; no HJR Extremities: extremities normal, atraumatic, no cyanosis, or edema  Pulses: 2+ and symmetric;  Skin: mobility and turgor normal, no evidence of bleeding or bruising and no lesions noted Neurologic: Mental status: Alert & oriented x 3, thought content appropriate; non-focal exam.  Pleasant mood & affect. Cranial nerves: normal (II-XII  grossly intact)    Adult ECG Report - Not checked ER EKG basically just a sinus tachycardia without significant ST changes.  Other studies Reviewed: Additional studies/ records that were reviewed today include:  Recent Labs:  n/a    ASSESSMENT / PLAN: Problem List Items Addressed This Visit    Chest pain with moderate risk for cardiac etiology - Primary    Her chest pain episode sounded very convincing and concerning, however she did rule out for MI and has had most episodes occurring at rest and not with exertion. They usually happen in the morning and afternoon, not necessarily at night. Not she did with any particular activity or stress. I tried explained to her that if she had that long episode of chest pain that was an MI she ruled in with positive troponins. She also would then have exertional chest discomfort going forward. I did explain the pathophysiology  of acute coronary syndrome.  The fact that she's not having exertional chest pain, but having resting symptoms makes me concerned now more for coronary spasm as opposed to acute coronary syndrome.  Plan: We'll evaluate for any existing CAD with coronary CTA which allows for CT FFR. We'll prescribe when necessary nitroglycerin which she can take for chest pain. If this does help, then I will also have her start Imdur 30 mg daily.      Relevant Orders   CT CORONARY MORPH W/CTA COR W/SCORE W/CA W/CM &/OR WO/CM   CT CORONARY FRACTIONAL FLOW RESERVE DATA PREP   CT CORONARY FRACTIONAL FLOW RESERVE FLUID ANALYSIS   Family history of premature CAD    She does have family history of CAD, but it is not first-degree family members. Since she is having significant episodes of chest discomfort over last 2 months, we will evaluate for coronary disease and possible ischemic disease with a coronary CT angiogram/CT FFR.       Other Visit Diagnoses    Family history of premature coronary artery disease       Relevant Orders   CT CORONARY MORPH W/CTA COR W/SCORE W/CA W/CM &/OR WO/CM   CT CORONARY FRACTIONAL FLOW RESERVE DATA PREP   CT CORONARY FRACTIONAL FLOW RESERVE FLUID ANALYSIS      Current medicines are reviewed at length with the patient today. (+/- concerns) n/a The following changes have been made: n/a  Patient Instructions  Your physician has recommended you make the following change in your medication:  -- START aspirin 81mg  once daily -- START nitroglycerin (NTG) as needed -- if NTG helps symptoms, then START isosorbide mononitrate (imdur) 30mg  at night  -- take imdur 30 minutes after daily aspirin  Cardiac CT Angiography (CTA), is a special type of CT scan that uses a computer to produce multi-dimensional views of major blood vessels throughout the body. In CT angiography, a contrast material is injected through an IV to help visualize the blood vessels  Your physician recommends that  you schedule a follow-up appointment in 4-6 weeks with Dr. Herbie Baltimore after your testing.    Studies Ordered:   Orders Placed This Encounter  Procedures  . CT CORONARY MORPH W/CTA COR W/SCORE W/CA W/CM &/OR WO/CM  . CT CORONARY FRACTIONAL FLOW RESERVE DATA PREP  . CT CORONARY FRACTIONAL FLOW RESERVE FLUID ANALYSIS      Bryan Lemma, M.D., M.S. Interventional Cardiologist   Pager # [phone] Phone # [phone] [address] [place], [place] [address]

## 2017-04-01 NOTE — Patient Instructions (Addendum)
Your physician has recommended you make the following change in your medication:  -- START aspirin 81mg  once daily -- START nitroglycerin (NTG) as needed -- if NTG helps symptoms, then START isosorbide mononitrate (imdur) 30mg  at night  -- take imdur 30 minutes after daily aspirin  Cardiac CT Angiography (CTA), is a special type of CT scan that uses a computer to produce multi-dimensional views of major blood vessels throughout the body. In CT angiography, a contrast material is injected through an IV to help visualize the blood vessels  Your physician recommends that you schedule a follow-up appointment in 4-6 weeks with Dr. Herbie BaltimoreHarding after your testing.

## 2017-04-02 ENCOUNTER — Telehealth: Payer: Self-pay | Admitting: Cardiology

## 2017-04-02 NOTE — Telephone Encounter (Signed)
Spoke with patient's husband who wanted to know why she has not been schedule yet for the Ct.   I explain that we were waiting on the precert and once that is done I will call her to schedule.   He then stated that his wife had called [employer] and was told it would take 7 days to precert .  He stated that it needed to be done now and not in 7 days, due to she has a blood clot.  He wanted to pay for the test and  I explained,  that I didn't know the cost of the test and that I wouldn't be able to get it scheduled, not til the end of the month.  He was upset and stated that he was going some where else.   I was trying to talk with him to let them know that I was going to call the nurse to discuss this but he hung the phone up. I researched the chart and Dr. Herbie BaltimoreHarding had not completed this note. I the spoke with Eileen StanfordJenna - Rn, who is going to reach out to Dr. Herbie BaltimoreHarding to discuss.

## 2017-04-02 NOTE — Telephone Encounter (Signed)
Spoke w/Stacy in CT & Omar PersonSharon Ferguson regarding patient's husband's concerns. Advised that beyond the dx listed for the CT, I am not certain about any other conditions or what the MD is looking for. I have personally reached out to Dr. Herbie BaltimoreHarding to see if he can offer any guidance on what he is looking for, since note is not in EPIC yet.   Of note, I vaguely recall MD mentioned he was looking for coronary spasm, but cannot say this with certainty.

## 2017-04-02 NOTE — Telephone Encounter (Signed)
Please let them know that the indication for Coronary CTA was that we are looking for a heart artery blockage that could cause resting pain.  I told the patient that I was concerned about her having coronary spasm & never mentioned that I thought she had a "clot".  I told her that a heart attack was due to cholesterol plaque "rupture" & a clot forms in the artery, but did not say that I felt like this was what happened to her, b/c she would have ruled in with an MI in the hospital & would be having exertional CP.  I purposely did not order a ST b/c I feel that it would be negative & not helpful.  A CT Angio would ne more helpful b/c it tells us anatomy.  I gave her NTG to use if she has Sx - & if it works, to use Imdur.    Bryan Lemmaavid Harding, MD

## 2017-04-03 ENCOUNTER — Encounter: Payer: Self-pay | Admitting: Cardiology

## 2017-04-03 DIAGNOSIS — J329 Chronic sinusitis, unspecified: Secondary | ICD-10-CM | POA: Diagnosis not present

## 2017-04-03 NOTE — Assessment & Plan Note (Signed)
Her chest pain episode sounded very convincing and concerning, however she did rule out for MI and has had most episodes occurring at rest and not with exertion. They usually happen in the morning and afternoon, not necessarily at night. Not she did with any particular activity or stress. I tried explained to her that if she had that long episode of chest pain that was an MI she ruled in with positive troponins. She also would then have exertional chest discomfort going forward. I did explain the pathophysiology of acute coronary syndrome.  The fact that she's not having exertional chest pain, but having resting symptoms makes me concerned now more for coronary spasm as opposed to acute coronary syndrome.  Plan: We'll evaluate for any existing CAD with coronary CTA which allows for CT FFR. We'll prescribe when necessary nitroglycerin which she can take for chest pain. If this does help, then I will also have her start Imdur 30 mg daily.

## 2017-04-03 NOTE — Assessment & Plan Note (Signed)
She does have family history of CAD, but it is not first-degree family members. Since she is having significant episodes of chest discomfort over last 2 months, we will evaluate for coronary disease and possible ischemic disease with a coronary CT angiogram/CT FFR.

## 2017-04-04 NOTE — Telephone Encounter (Signed)
Follow up ° ° ° °Pt is returning call to Hayley.  °

## 2017-04-04 NOTE — Telephone Encounter (Signed)
lmtcb

## 2017-04-04 NOTE — Telephone Encounter (Signed)
Returned the call back to the patient. She stated that the Coronary CT has been scheduled. She has had some mild chest discomfort but nothing to take nitroglycerin for. She was understandable concerning the scheduling and is now scheduled for August 2nd.

## 2017-04-07 DIAGNOSIS — J329 Chronic sinusitis, unspecified: Secondary | ICD-10-CM | POA: Diagnosis not present

## 2017-04-10 DIAGNOSIS — J329 Chronic sinusitis, unspecified: Secondary | ICD-10-CM | POA: Diagnosis not present

## 2017-04-11 ENCOUNTER — Other Ambulatory Visit: Payer: Self-pay

## 2017-04-11 ENCOUNTER — Emergency Department (HOSPITAL_COMMUNITY): Payer: BLUE CROSS/BLUE SHIELD

## 2017-04-11 ENCOUNTER — Emergency Department (HOSPITAL_COMMUNITY)
Admission: EM | Admit: 2017-04-11 | Discharge: 2017-04-11 | Disposition: A | Discharge status: Home / Self Care | Payer: BLUE CROSS/BLUE SHIELD | Attending: Emergency Medicine | Admitting: Emergency Medicine

## 2017-04-11 ENCOUNTER — Telehealth: Payer: Self-pay | Admitting: Cardiology

## 2017-04-11 ENCOUNTER — Encounter (HOSPITAL_COMMUNITY): Payer: Self-pay | Admitting: *Deleted

## 2017-04-11 DIAGNOSIS — F17290 Nicotine dependence, other tobacco product, uncomplicated: Secondary | ICD-10-CM | POA: Diagnosis not present

## 2017-04-11 DIAGNOSIS — Z79899 Other long term (current) drug therapy: Secondary | ICD-10-CM | POA: Diagnosis not present

## 2017-04-11 DIAGNOSIS — R079 Chest pain, unspecified: Secondary | ICD-10-CM | POA: Diagnosis not present

## 2017-04-11 DIAGNOSIS — Z7982 Long term (current) use of aspirin: Secondary | ICD-10-CM | POA: Insufficient documentation

## 2017-04-11 DIAGNOSIS — R000 Tachycardia, unspecified: Secondary | ICD-10-CM | POA: Diagnosis not present

## 2017-04-11 LAB — CBC
HEMATOCRIT: 38.3 % (ref 36.0–46.0)
Hemoglobin: 12.9 g/dL (ref 12.0–15.0)
MCH: 30.9 pg (ref 26.0–34.0)
MCHC: 33.7 g/dL (ref 30.0–36.0)
MCV: 91.6 fL (ref 78.0–100.0)
Platelets: 320 10*3/uL (ref 150–400)
RBC: 4.18 MIL/uL (ref 3.87–5.11)
RDW: 12.5 % (ref 11.5–15.5)
WBC: 7 10*3/uL (ref 4.0–10.5)

## 2017-04-11 LAB — BASIC METABOLIC PANEL
Anion gap: 8 (ref 5–15)
BUN: 12 mg/dL (ref 6–20)
CHLORIDE: 110 mmol/L (ref 101–111)
CO2: 22 mmol/L (ref 22–32)
Calcium: 9.8 mg/dL (ref 8.9–10.3)
Creatinine, Ser: 0.76 mg/dL (ref 0.44–1.00)
GFR calc Af Amer: >60 mL/min (ref >60)
GFR calc non Af Amer: >60 mL/min (ref >60)
Glucose, Bld: 89 mg/dL (ref 65–99)
POTASSIUM: 3.8 mmol/L (ref 3.5–5.1)
SODIUM: 140 mmol/L (ref 135–145)

## 2017-04-11 LAB — I-STAT TROPONIN, ED: Troponin i, poc: 0 ng/mL (ref 0.00–0.08)

## 2017-04-11 MED ORDER — IOPAMIDOL (ISOVUE-370) INJECTION 76%
INTRAVENOUS | Status: AC
  Administered 2017-04-11: 80 mL via INTRAVENOUS
  Filled 2017-04-11: qty 100

## 2017-04-11 MED ORDER — NITROGLYCERIN 0.4 MG SL SUBL
SUBLINGUAL_TABLET | SUBLINGUAL | Status: AC
  Filled 2017-04-11: qty 1

## 2017-04-11 MED ORDER — METOPROLOL TARTRATE 5 MG/5ML IV SOLN
INTRAVENOUS | Status: AC
  Filled 2017-04-11: qty 5

## 2017-04-11 MED ORDER — NITROGLYCERIN 0.4 MG SL SUBL: 0.4000 mg | SUBLINGUAL_TABLET | SUBLINGUAL | Status: DC | PRN

## 2017-04-11 MED ORDER — METOPROLOL TARTRATE 5 MG/5ML IV SOLN
5.0000 mg | Freq: Once | INTRAVENOUS | Status: AC
  Administered 2017-04-11: 5 mg via INTRAVENOUS

## 2017-04-11 NOTE — Telephone Encounter (Signed)
Pt said she took the medicine that Dr Herbie BaltimoreHarding had prescribed last night.Pt says she is still having symptoms this morning,she wants to know what to do.Having chest pressure right now.

## 2017-04-11 NOTE — ED Notes (Signed)
Pt. Ambulated to restroom with steady gait.  

## 2017-04-11 NOTE — ED Provider Notes (Signed)
[facility]-EMERGENCY DEPT Provider Note   CSN: [id number] Arrival date & time: 04/11/17  1027     History   Chief Complaint Chief Complaint  Patient presents with  . Chest Pain    HPI Ashlee [name] is a 36 y.o. female.  The history is provided by the patient and medical records.     36 year old female with history of ankylosing spondylitis, presenting to the ED with chest pain. Patient has been seen multiple times over the past few weeks for similar complaints. States she has intermittent chest pain which she describes as a pressure with some numbness and tingling of her left arm. States symptoms never fully resolved, but wax and wane in their severity. She does report her symptoms are usually worse in the morning and in the evening. She has not noticed any alleviating or exacerbating factors. She has not noticed any exertional or positional component to her pain. She denies any shortness of breath. Patient had recurrent episode of pain this morning and took some nitroglycerin with some relief. Patient is concerned about the numerous instances of her pain and call the cardiology office told her to come here. Patient is followed by Dr. Herbie BaltimoreHarding. She has been seen in the clinic within the past 2 weeks for similar symptoms. She is scheduled for outpatient CT of her coronaries, this is scheduled for next week. Patient denies smoking history. She does have a significant family history of cardiac disease. She has no history of DVT or PE.  Past Medical History:  Diagnosis Date  . Ankylosing spondylitis [facility](HCC)     Patient Active Problem List   Diagnosis Date Noted  . Chest pain with moderate risk for cardiac etiology 04/01/2017  . Family history of premature CAD 04/01/2017  . Paresthesias 02/14/2014    Past Surgical History:  Procedure Laterality Date  . ABLATION  2012  . CESAREAN SECTION  2007, 2012   x 2   . NOSE SURGERY    . TONSILLECTOMY  2009  . TUBAL LIGATION  2012    OB History    No data available       Home Medications    Prior to Admission medications   Medication Sig Start Date End Date Taking? Authorizing Provider  aspirin EC 81 MG tablet Take 81 mg by mouth daily.    [provider]  cetirizine (ZYRTEC) 10 MG tablet Take 10 mg by mouth as needed for allergies.    [provider]  fluconazole (DIFLUCAN) 200 MG tablet TAKE 1 TABLET BY MOUTH TWICE A WEEK 02/25/17   [provider]  HUMIRA PEN 40 MG/0.8ML PNKT Place 40 mg onto the skin every 14 (fourteen) days.  03/10/17   [provider]  ibuprofen (ADVIL,MOTRIN) 200 MG tablet Take 400-800 mg by mouth every 8 (eight) hours as needed for mild pain or moderate pain.     [provider]  isosorbide mononitrate (IMDUR) 30 MG 24 hr tablet Take 1 tablet (30 mg total) by mouth daily. 04/01/17 06/30/17  Marykay LexHarding, David W, MD  Multiple Vitamin (MULTIVITAMIN) capsule Take 1 capsule by mouth daily.    [provider]  nitroGLYCERIN (NITROSTAT) 0.4 MG SL tablet Place 1 tablet (0.4 mg total) under the tongue every 5 (five) minutes as needed for chest pain. MAX 3 doses 04/01/17 06/30/17  Marykay LexHarding, David W, MD    Family History Family History  Problem Relation Age of Onset  . Hypertension Father   . Skin cancer Sister   .  Heart failure Maternal Grandmother   . Ankylosing spondylitis Maternal Grandmother        Her mother also had along with 4 sisters  . Skin cancer Maternal Grandfather   . Arrhythmia Paternal Grandmother   . Diabetes Paternal Grandfather   . Kidney cancer Paternal Grandfather   . Cancer Sister   . Cardiomyopathy Maternal Aunt 45       With LBBB    Social History Social History  Substance Use Topics  . Smoking status: Former Smoker    Packs/day: 1.00    Types: E-cigarettes    Quit date: 03/28/2017  . Smokeless tobacco: Never Used     Comment: Quit in 07/2013  . Alcohol use Yes     Comment: occasionally     Allergies   Wellbutrin  [bupropion]   Review of Systems Review of Systems   Physical Exam Updated Vital Signs BP 114/78 (BP Location: Right Arm)   Pulse 95   Temp 98 F (36.7 C) (Oral)   Resp 18   SpO2 100%   Physical Exam  Constitutional: She is oriented to person, place, and time. She appears well-developed and well-nourished.  HENT:  Head: Normocephalic and atraumatic.  Mouth/Throat: Oropharynx is clear and moist.  Eyes: Pupils are equal, round, and reactive to light. Conjunctivae and EOM are normal.  Neck: Normal range of motion.  Cardiovascular: Normal rate, regular rhythm and normal heart sounds.   Pulmonary/Chest: Effort normal and breath sounds normal.  Abdominal: Soft. Bowel sounds are normal.  Musculoskeletal: Normal range of motion.  Neurological: She is alert and oriented to person, place, and time.  Skin: Skin is warm and dry.  Psychiatric: Her affect is angry. She is agitated.  Nursing note and vitals reviewed.    ED Treatments / Results  Labs (all labs ordered are listed, but only abnormal results are displayed) Labs Reviewed  BASIC METABOLIC PANEL  CBC  I-STAT TROPONIN, ED    EKG  EKG Interpretation  Date/Time:  Friday April 11 2017 10:29:56 EDT Ventricular Rate:  104 PR Interval:  122 QRS Duration: 86 QT Interval:  320 QTC Calculation: 420 R Axis:   63 Text Interpretation:  Sinus tachycardia Otherwise normal ECG since last tracing no significant change Confirmed by Eber Hong ([id number]) on 04/11/2017 12:28:45 PM       Radiology Dg Chest 2 View  Result Date: 04/11/2017 CLINICAL DATA:  Chest pain. EXAM: CHEST  2 VIEW COMPARISON:  03/29/2017 FINDINGS: The heart size and mediastinal contours are within normal limits. Both lungs are clear. The visualized skeletal structures are unremarkable. IMPRESSION: Normal exam. Electronically Signed   By: Francene Boyers M.D.   On: 04/11/2017 11:15    Procedures Procedures (including critical care time)  Medications Ordered in  ED Medications - No data to display   Initial Impression / Assessment and Plan / ED Course  I have reviewed the triage vital signs and the nursing notes.  Pertinent labs & imaging results that were available during my care of the patient were reviewed by me and considered in my medical decision making (see chart for details).  36 year old female here with recurrent chest pain. Has been evaluated by cardiology as well as the ED for this multiple times in the past few weeks. Her cardiologist, Dr. Herbie Baltimore, has arranged for CT of her coronaries to be done next week. Based on notes, there is suspicion for possible vasospasm. EKG here sinus rhythm without acute ischemic changes. Troponin is negative. Chest x-ray is  clear. When talking with patient and her husband, they are somewhat hostile and aggressive towards myself as well as RN about are "lack of urgency" for getting her CT's done and wonder why we keep ordering the same tests over and over again.  I had a long discussion with patient and husband that coronary CT's are typically ordered in the ED setting and has to be read by one of the cardiologists and we have limitations and her cardiac evaluation from an ED setting.  They remain somewhat frustrated that she has not been given a formal diagnosis for her symptoms.  After discussing with cardiology, I have made them aware of patient and her husband's concerns. They have arranged for her to have her coronary CT performed today. They will provide formal consultation.  Disposition will be based on their recommendations.  3:25 PM Have spoken with Dr. Herbie BaltimoreHarding-- study is negative for any acute disease.  Can be discharged home.  He has prescribed her Imdur for home, can continue her nitro SL.  She can follow-up in clinic as needed.  Discussed plan with patient, she acknowledged understanding and agreed with plan of care.  Return precautions given for new or worsening symptoms.  Final Clinical Impressions(s)  / ED Diagnoses   Final diagnoses:  Chest pain, unspecified type    New Prescriptions New Prescriptions   No medications on file     Garlon HatchetSanders, Lisa M, PA-C 04/11/17 1511    Garlon HatchetSanders, Lisa M, PA-C 04/11/17 1513    Garlon HatchetSanders, Lisa M, PA-C 04/11/17 1533    Eber HongMiller, Brian, MD 04/11/17 [phone]

## 2017-04-11 NOTE — ED Notes (Signed)
Cardiology at bedside.

## 2017-04-11 NOTE — Consult Note (Addendum)
Cardiology Consultation:   Patient ID: Ashlee FileSara D Brzoska; [id number]; Aug 26, 1981   Admit date: 04/11/2017 Date of Consult: 04/11/2017  [facility] Primary Cardiologist: Dr. Herbie BaltimoreHarding Primary Electrophysiologist: none    Patient Profile:   Ashlee [name] is a 36 y.o. female with a hx of ankylosing spondylitis and family hx of premature CAD who is being seen today for the evaluation of chest pain at the request of Dr. Hyacinth MeekerMiller.  History of Present Illness:   Ms. Laneta SimmersBrust has been followed by Dr. Herbie BaltimoreHarding for recurrent chest pain. She had had multiple ER visits for chest pain and abdominal pain, each time she rules out for MI.  She was seen in the office by Dr. Herbie BaltimoreHarding on 04/01/17 for evaluation. She describe on episode on 03/26/17 as severe intense chest pressure that was associated with nausea and dyspnea. She was unable to throw up when she got up to go to the bathroom, and sat on the floor crying after her husband for help. They called 911. He indicated she was diaphoretic, pale with slow breathing and lethargy. Both arms were numb. She thought she was having heart attack. She has poor recollection of the event the time the cold 911. She's never had an episode like that before, and has not had another episode since, to that extent.  For about 2 months leading up to that episode the 11th, she had been noticing intermittent chest pressure with nausea and sweating lasting maybe 10 minutes. These would come and go associated with flushing.  Episodes are not necessarily associated with any particular activity. Often occurring at rest.  She has also noted fatigue, low energy and a complete sense of exhaustion. She is also had left arm pain, not necessarily associated with the chest pain and nausea. She also has had occasional episodes of lightheadedness where she felt like she may pass out but has not had true syncope.  Reportedly, none of these episodes are  associated with any particular activity. She is able to routine activity without having chest pain or pressure. No dyspnea is certainly a day with exertion.  No PND, orthopnea or edema. She has not had any palpitations or syncope. No TIA or amaurosis fugax symptoms.  Dr. Herbie BaltimoreHarding Rx'd SL NTG and imdur 30mg  daily. He also ordered a cardiac CT which was set up for 04/17/17.    However, before she could get this test, she presented [facility] today with a similar episode to previous. Chest pain was resolved upon arrival to the ED.   She basically describes 10-15 minute episode of chest discomfort today very similar to her similar symptoms. She's had severe diaphoresis  associated with nausea and dyspnea.  The symptoms are very similar to what she mentioned when I saw her in clinic  Past Medical History:  Diagnosis Date  . Ankylosing spondylitis (HCC)     Past Surgical History:  Procedure Laterality Date  . ABLATION  2012  . CESAREAN SECTION  2007, 2012   x 2   . NOSE SURGERY    . TONSILLECTOMY  2009  . TUBAL LIGATION  2012     Inpatient Medications: Scheduled Meds: . iopamidol       Continuous Infusions:  PRN Meds:   Allergies:    Allergies  Allergen Reactions  . Wellbutrin [Bupropion] Hives    Social History:   Social History   Social History  . Marital status: Married    Spouse name:  N/A  . Number of children: 3  . Years of education: N/A   Occupational History  . office manager     [employer]   Social History Main Topics  . Smoking status: Former Smoker    Packs/day: 1.00    Types: E-cigarettes    Quit date: 03/28/2017  . Smokeless tobacco: Never Used     Comment: Quit in 07/2013  . Alcohol use Yes     Comment: occasionally  . Drug use: No  . Sexual activity: Yes    Birth control/ protection: Surgical   Other Topics Concern  . Not on file   Social History Narrative   Married with 3 children   Right handed   12 th grade education     1 cup coffee daily   Work: Technical brewer for [employer]          Family History:   The patient's family history includes Ankylosing spondylitis in her maternal grandmother; Arrhythmia in her paternal grandmother; Cancer in her sister; Cardiomyopathy (age of onset: 79) in her maternal aunt; Diabetes in her paternal grandfather; Heart failure in her maternal grandmother; Hypertension in her father; Kidney cancer in her paternal grandfather; Skin cancer in her maternal grandfather and sister.  ROS:  Please see the history of present illness.  Review of Systems  Constitution: Negative for decreased appetite, weakness and malaise/fatigue.  HENT: Negative for congestion and hoarse voice.   Cardiovascular: Positive for chest pain and near-syncope (Only with chest). Negative for irregular heartbeat and palpitations.  Gastrointestinal: Positive for nausea (With pain).  Neurological: Negative for light-headedness (Except with extreme pain).  Psychiatric/Behavioral: Negative for suicidal ideas. The patient is nervous/anxious.   All other systems reviewed and are negative.   All other ROS reviewed and negative.     Physical Exam/Data:   Vitals:   04/11/17 1032 04/11/17 1215 04/11/17 1245 04/11/17 1315  BP: 114/78 107/71 107/71 106/71  Pulse: 95 75 68 76  Resp: 18 14 16  (!) 21  Temp: 98 F (36.7 C)     TempSrc: Oral     SpO2: 100% 100% 100% 100%   No intake or output data in the 24 hours ending 04/11/17 1341 There were no vitals filed for this visit. There is no height or weight on file to calculate BMI.  General:  Well nourished, well developed, in no acute distress, appears anxious and tearful HEENT: normal Lymph: no adenopathy Neck: no JVD Endocrine:  No thryomegaly Vascular: No carotid bruits; FA pulses 2+ bilaterally without bruits  Cardiac:  normal S1, S2; RRR; no murmur  Lungs:  clear to auscultation bilaterally, no wheezing, rhonchi or rales  Abd: soft, nontender, no  hepatomegaly  Ext: no edema Musculoskeletal:  No deformities, BUE and BLE strength normal and equal Skin: warm and dry  Neuro:  CNs 2-12 intact, no focal abnormalities noted Psych:  Normal affect   EKG:  The EKG was personally reviewed and demonstrates:  Sinus tachy HR 104 Telemetry:  Telemetry was personally reviewed and demonstrates:  NSR with intermittent sinus tach  Relevant CV Studies: none  Laboratory Data:  ChemistryNo results for input(s): NA, K, CL, CO2, GLUCOSE, BUN, CREATININE, CALCIUM, GFRNONAA, GFRAA, ANIONGAP in the last 168 hours.  No results for input(s): PROT, ALBUMIN, AST, ALT, ALKPHOS, BILITOT in the last 168 hours. HematologyNo results for input(s): WBC, RBC, HGB, HCT, MCV, MCH, MCHC, RDW, PLT in the last 168 hours. Cardiac EnzymesNo results for input(s): TROPONINI in the last 168 hours.  Recent Labs Lab 04/11/17 1054  TROPIPOC 0.00    BNPNo results for input(s): BNP, PROBNP in the last 168 hours.  DDimer No results for input(s): DDIMER in the last 168 hours.  Radiology/Studies:  Dg Chest 2 View  Result Date: 04/11/2017 CLINICAL DATA:  Chest pain. EXAM: CHEST  2 VIEW COMPARISON:  03/29/2017 FINDINGS: The heart size and mediastinal contours are within normal limits. Both lungs are clear. The visualized skeletal structures are unremarkable. IMPRESSION: Normal exam. Electronically Signed   By: Francene BoyersJames  Maxwell M.D.   On: 04/11/2017 11:15    Assessment and Plan:   Chest pain: no objective signs of ischemia with negative troponin and non acute ECG. Given her anxiety over the chest pain, we will obtain her cardiac CT today. We will make subsequent decisions based on these findings.  Ankylosing spondylitis: continue Humira    Signed, Cline CrockKathryn Thompson, PA-C  04/11/2017 1:41 PM   I have seen, examined and evaluated the patient this PM in ER along with Ms. Janee Mornhompson.  After reviewing all the available data and chart, we discussed the patients laboratory, study &  physical findings as well as symptoms in detail. I agree with her findings, examination as well as impression recommendations as per our discussion.    Attending adjustments noted in italics.   Maralyn SagoSarah presents with these very similar symptoms that she saw me in clinic to discuss. Based on the recurrent symptoms and their concerns, we have arranged for her to have her cardiac calcium score/CTA done here in the hospital.  Coronary calcium score was read as 0. No plaque or stenosis noted on coronary arteries and a right dominant system. Negative overview by radiology  This basically means that her symptoms are not acute coronary syndrome and she does not have existing coronary disease. Excellent ice to not convinced that she doesn't have coronary spasm since her symptoms are somewhat relieved with nitroglycerin. Plan will be to have her take twice a day Imdur 30 mg for a week and then go to just once a day. She can also use when necessary nitroglycerin. I will see her back in a couple months. If symptoms not really with nitroglycerin, I think there probably musculoskeletal in nature and likely costochondritis. I've asked that she talk about it with her rheumatologist to determine the best analgesic treatment as she is on Humira and I would be reluctant to give her NSAIDs or steroids.  She is okay for discharge from the emergency room. Her outpatient CT angiogram will be canceled, she can delay her follow-up appointment with me for another month or 2 as she just been seen now.  I discussed this plan with the EDP. I personally discussed the findings and plan with the patient and her husband. They were very relieved with her results.   Bryan Lemmaavid Dollie Mayse, M.D., M.S. Interventional Cardiologist   Pager # [phone] Phone # [phone] [address] [place], [place] [address]

## 2017-04-11 NOTE — Telephone Encounter (Signed)
Spoke with pt she states that she is not taking her imdur ER yet per Dr Elissa HeftyHarding's direction. She was told to try the nitro first and see if this works. Pt c/o that chest pain/pressure, back and shoulder pain, slight diaphoreses and fatigue (denies SOB or nausea) has only resolved a few times since last ER visit and last OV with Dr Herbie BaltimoreHarding and has started within a few hours. She states that last night the pain/pressure was much worse and was having bach and shoulder pain and this is where her pain has been  declines going to the ER at this time but she states that she does not know if she should go or try to take her SL nitro again. She is concerned but still states that she does not know, verbalized direction that this is the time to go but she states that she will "think about it". She will go if she feels that she needs to.

## 2017-04-11 NOTE — ED Triage Notes (Signed)
Pt reports mid radiating CP to mid upper back last night with some relief after x 2 sL nitro, pt reports worsening pain today with pain in mid upper back & L arm, pt reports Herbie BaltimoreHarding is her cardiologist and she has been seen multiple times for this over the last 3 wks, pt A&O x4

## 2017-04-11 NOTE — Telephone Encounter (Signed)
The patient was seen in the emergency room - consult note in place. CT angiogram performed noted in my note.  Recommendations are in my consult note. I had a long discussion with her and her husband today.  All were happy.  Bryan Lemmaavid Daelin Haste, MD

## 2017-04-11 NOTE — Discharge Instructions (Signed)
Take the imdur Dr. Herbie BaltimoreHarding has written for you.  Can continue your nitro as well. Follow-up with Dr. Herbie BaltimoreHarding in clinic. Return to the ED for new or worsening symptoms.

## 2017-04-14 DIAGNOSIS — J329 Chronic sinusitis, unspecified: Secondary | ICD-10-CM | POA: Diagnosis not present

## 2017-04-14 NOTE — Telephone Encounter (Signed)
NOTED

## 2017-04-16 DIAGNOSIS — L700 Acne vulgaris: Secondary | ICD-10-CM | POA: Diagnosis not present

## 2017-04-16 DIAGNOSIS — L818 Other specified disorders of pigmentation: Secondary | ICD-10-CM | POA: Diagnosis not present

## 2017-04-17 ENCOUNTER — Ambulatory Visit (HOSPITAL_COMMUNITY): Payer: BLUE CROSS/BLUE SHIELD

## 2017-04-17 DIAGNOSIS — J329 Chronic sinusitis, unspecified: Secondary | ICD-10-CM | POA: Diagnosis not present

## 2017-04-21 DIAGNOSIS — J329 Chronic sinusitis, unspecified: Secondary | ICD-10-CM | POA: Diagnosis not present

## 2017-04-24 DIAGNOSIS — J329 Chronic sinusitis, unspecified: Secondary | ICD-10-CM | POA: Diagnosis not present

## 2017-04-28 DIAGNOSIS — J329 Chronic sinusitis, unspecified: Secondary | ICD-10-CM | POA: Diagnosis not present

## 2017-04-30 DIAGNOSIS — J329 Chronic sinusitis, unspecified: Secondary | ICD-10-CM | POA: Diagnosis not present

## 2017-05-05 DIAGNOSIS — J329 Chronic sinusitis, unspecified: Secondary | ICD-10-CM | POA: Diagnosis not present

## 2017-05-09 ENCOUNTER — Encounter: Payer: Self-pay | Admitting: Cardiology

## 2017-05-09 ENCOUNTER — Ambulatory Visit (INDEPENDENT_AMBULATORY_CARE_PROVIDER_SITE_OTHER): Payer: BLUE CROSS/BLUE SHIELD | Admitting: Cardiology

## 2017-05-09 VITALS — BP 92/56 | HR 77 | Ht 64.0 in | Wt 155.8 lb

## 2017-05-09 DIAGNOSIS — J329 Chronic sinusitis, unspecified: Secondary | ICD-10-CM | POA: Diagnosis not present

## 2017-05-09 DIAGNOSIS — I201 Angina pectoris with documented spasm: Secondary | ICD-10-CM | POA: Insufficient documentation

## 2017-05-09 DIAGNOSIS — Z8249 Family history of ischemic heart disease and other diseases of the circulatory system: Secondary | ICD-10-CM

## 2017-05-09 NOTE — Patient Instructions (Signed)
START EXERCISE IN THE MORNING, AND CONTINUE ISOSORBIDE MONONITRATE IN THE BEDTIME.  NO OTHER CHANGES   Your physician recommends that you schedule a follow-up appointment in JAN 2019 WITH DR HARDING.

## 2017-05-09 NOTE — Progress Notes (Signed)
PCP: [facility]  Clinic Note: Chief Complaint  Patient presents with  . Follow-up    Chest Pain - likely coronary spasm    HPI: Ashlee [name] is a 36 y.o. female with a PMH below who presents today for follow-up evaluation of chest discomfortshe also had a recent emergency room visit when I saw her for similar symptoms. She had a coronary CTA done at that time.. She is being seen at the request of [facility]  [name] was initially seen on 04/01/2017 when she noted several episodes of prolonged chest discomfort occurring at rest on an associated severe dyspnea. She actually had a near syncopal episode.  Episode followed by a sense of fatigue and exhaustion. Associated with nausea, sweating, and dyspnea with a discomfort described as a chest pressure.  Recent Hospitalizations:   ER visit on July 27 - I actually saw her there in consultation for a prolonged episode of severe intense chest pressure. By the time I actually saw her her symptoms have resolved. We were able to get her Coronary Calcium Score and coronary CTA ordered and read that day which reassured her as results were essentially normal. We then went with the diagnosis that these symptoms are most likely related to Prinzmetal Variant Angina --> due to her baseline hypotension, we did not have the ability to use amlodipine, but I did prescribe her Imdur, and sublingual nitroglycerin.   Studies Personally Reviewed - (if available, images/films reviewed: From Epic Chart or Care Everywhere)  Cor Calcium Score Coronary CTA 04/11/2017:  1. Coronary artery calcium score of 0 Agatston units, suggesting low risk for future cardiac events. 2.  No plaque or stenosis noted in the coronary arteries.  Interval History:   Ashlee [name] returns today with a smile on her face and happy about how she has been doing since her ER visit. She says these episodes have notably improved with the Imdur and she is only had  to use nitroglycerin glycerin 3 times. Each occasion, the nitroglycerin resolved the symptoms within a few minutes. She has not had nearly as much of the dramatic episodes since starting Imdur. She did say the first week of taking Imdur was associated with pretty significant headache, but when she got over the headache, she is feeling much better now. At this point, she is really only noticing her episodes in the morning as she is starting her morning routine. She does not have any symptoms at all associated with exertional activity, and is not having exertional dyspnea. She is also not noting any other cardiac symptoms such as PND, orthopnea or edema.  She may have intermittent heart skipping, but no prolonged palpitations, rapid irregular heartbeats. She is no longer noticing the lightheadedness and dizziness associated with her chest pain. No syncope/near syncope or TIA/amaurosis fugax symptoms.  ROS: A comprehensive was performed. Review of Systems  Constitutional: Negative for malaise/fatigue (Sleeping better).  Respiratory: Negative.   Gastrointestinal: Negative for blood in stool and melena.  Genitourinary: Negative for hematuria.  Musculoskeletal: Positive for back pain (Really chronic pain from her ankylosing spondylitis).  Psychiatric/Behavioral: The patient is nervous/anxious (Notably improved. I think now that she has an answer on about her coronary CTA and we have planned for treating her, both she and her husband are much less stressed).   All other systems reviewed and are negative.   I have reviewed and (if needed) personally updated the patient's problem list, medications, allergies, past medical and  surgical history, social and family history.   Past Medical History:  Diagnosis Date  . Ankylosing spondylitis (HCC)   . Prinzmetal variant angina (HCC) 03/2017   Coronary calcium score was 0 with no evidence of coronary disease. However prolonged episodes of substernal chest pain  relieved with nitroglycerin.    Past Surgical History:  Procedure Laterality Date  . ABLATION  2012  . CESAREAN SECTION  2007, 2012   x 2   . NOSE SURGERY    . TONSILLECTOMY  2009  . TUBAL LIGATION  2012    Current Meds  Medication Sig  . cetirizine (ZYRTEC) 10 MG tablet Take 10 mg by mouth as needed for allergies.  . fluconazole (DIFLUCAN) 200 MG tablet TAKE 200mg  BY MOUTH TWICE A WEEK  . HUMIRA PEN 40 MG/0.8ML PNKT Place 40 mg onto the skin every 14 (fourteen) days.   Marland Kitchen ibuprofen (ADVIL,MOTRIN) 200 MG tablet Take 400-800 mg by mouth every 8 (eight) hours as needed for mild pain or moderate pain.   . isosorbide mononitrate (IMDUR) 30 MG 24 hr tablet Take 1 tablet (30 mg total) by mouth daily.  . Multiple Vitamin (MULTIVITAMIN) capsule Take 1 capsule by mouth daily.  . nitroGLYCERIN (NITROSTAT) 0.4 MG SL tablet Place 1 tablet (0.4 mg total) under the tongue every 5 (five) minutes as needed for chest pain. MAX 3 doses    Allergies  Allergen Reactions  . Wellbutrin [Bupropion] Hives    Social History   Social History  . Marital status: Married    Spouse name: N/A  . Number of children: 3  . Years of education: N/A   Occupational History  . office manager     [employer]   Social History Main Topics  . Smoking status: Former Smoker    Packs/day: 1.00    Types: E-cigarettes    Quit date: 03/28/2017  . Smokeless tobacco: Never Used     Comment: Quit in 07/2013  . Alcohol use Yes     Comment: occasionally  . Drug use: No  . Sexual activity: Yes    Birth control/ protection: Surgical   Other Topics Concern  . None   Social History Narrative   Married with 3 children   Right handed   12 th grade education   1 cup coffee daily   Work: Technical brewer for [employer]          family history includes Ankylosing spondylitis in her maternal grandmother; Arrhythmia in her paternal grandmother; Cancer in her sister; Cardiomyopathy (age of onset: 33) in  her maternal aunt; Diabetes in her paternal grandfather; Heart failure in her maternal grandmother; Hypertension in her father; Kidney cancer in her paternal grandfather; Skin cancer in her maternal grandfather and sister.  Wt Readings from Last 3 Encounters:  05/09/17 155 lb 12.8 oz (70.7 kg)  04/01/17 146 lb (66.2 kg)  03/26/17 145 lb (65.8 kg)    PHYSICAL EXAM BP (!) 92/56   Pulse 77   Ht 5\' 4"  (1.626 m)   Wt 155 lb 12.8 oz (70.7 kg)   SpO2 99%   BMI 26.74 kg/m  Physical Exam  Constitutional: She is oriented to person, place, and time. She appears well-developed and well-nourished. No distress.  Well-groomed  HENT:  Head: Normocephalic and atraumatic.  Mouth/Throat: Oropharynx is clear and moist.  Eyes: Pupils are equal, round, and reactive to light. EOM are normal.  Neck: No hepatojugular reflux and no JVD present. Carotid bruit is not present.  Cardiovascular: Normal rate, regular rhythm, normal heart sounds and intact distal pulses.  Exam reveals no gallop and no friction rub.   No murmur heard. Pulmonary/Chest: Effort normal and breath sounds normal. No respiratory distress. She has no wheezes. She has no rales. She exhibits no tenderness.  Abdominal: Soft. Bowel sounds are normal. She exhibits no distension. There is no tenderness. There is no rebound.  Musculoskeletal: Normal range of motion. She exhibits no edema or deformity.  Neurological: She is alert and oriented to person, place, and time.  Skin: Skin is warm and dry. No rash noted. She is not diaphoretic. No erythema.  Psychiatric: She has a normal mood and affect. Her behavior is normal.  Nursing note and vitals reviewed.    Adult ECG Report  not checked  Other studies Reviewed: Additional studies/ records that were reviewed today include:  Recent Labs:   Lab Results  Component Value Date   CREATININE 0.76 04/11/2017   BUN 12 04/11/2017   NA 140 04/11/2017   K 3.8 04/11/2017   CL 110 04/11/2017   CO2  22 04/11/2017   Lab Results  Component Value Date   TROPONINI <0.03 03/26/2017    ASSESSMENT / PLAN: Problem List Items Addressed This Visit    Family history of premature CAD    Very reassuring results calcium score and CTA. She is very happy if there is no evidence of invasive coronary disease.      Prinzmetal variant angina (HCC) - Primary (Chronic)    At least 25 minutes of the total 35 minutes (>50%) was spent with the patient in direct consultation discussing her symptoms, and coming up with a plan for how to manage it. We talked about several different options, and finally came up with the plan to have her continue taking the nitrate at night and start exercising the morning in order to try to preclude her having her spasm episodes. She still has when necessary nitroglycerin. If she has an episode in the morning, she will double her Imdur.         --Other potential option would be to switch the Imdur dose to morning. Her hypotension precludes the use of amlodipine.   Current medicines are reviewed at length with the patient today. (+/- concerns) n/a The following changes have been made: n/a  Patient Instructions  START EXERCISE IN THE MORNING, AND CONTINUE ISOSORBIDE MONONITRATE IN THE BEDTIME.  NO OTHER CHANGES   Your physician recommends that you schedule a follow-up appointment in JAN 2019 WITH DR HARDING.    Studies Ordered:   No orders of the defined types were placed in this encounter.     Bryan Lemma, M.D., M.S. Interventional Cardiologist   Pager # [phone] Phone # [phone] [address] [place], [place] [address]

## 2017-05-12 DIAGNOSIS — J329 Chronic sinusitis, unspecified: Secondary | ICD-10-CM | POA: Diagnosis not present

## 2017-05-15 ENCOUNTER — Encounter: Payer: Self-pay | Admitting: Cardiology

## 2017-05-15 DIAGNOSIS — J329 Chronic sinusitis, unspecified: Secondary | ICD-10-CM | POA: Diagnosis not present

## 2017-05-15 NOTE — Assessment & Plan Note (Signed)
Very reassuring results calcium score and CTA. She is very happy if there is no evidence of invasive coronary disease.

## 2017-05-15 NOTE — Assessment & Plan Note (Signed)
At least 25 minutes of the total 35 minutes (>50%) was spent with the patient in direct consultation discussing her symptoms, and coming up with a plan for how to manage it. We talked about several different options, and finally came up with the plan to have her continue taking the nitrate at night and start exercising the morning in order to try to preclude her having her spasm episodes. She still has when necessary nitroglycerin. If she has an episode in the morning, she will double her Imdur.

## 2017-05-16 DIAGNOSIS — L258 Unspecified contact dermatitis due to other agents: Secondary | ICD-10-CM | POA: Diagnosis not present

## 2017-05-16 DIAGNOSIS — L700 Acne vulgaris: Secondary | ICD-10-CM | POA: Diagnosis not present

## 2017-05-22 DIAGNOSIS — J329 Chronic sinusitis, unspecified: Secondary | ICD-10-CM | POA: Diagnosis not present

## 2017-05-26 DIAGNOSIS — J329 Chronic sinusitis, unspecified: Secondary | ICD-10-CM | POA: Diagnosis not present

## 2017-06-02 DIAGNOSIS — J309 Allergic rhinitis, unspecified: Secondary | ICD-10-CM | POA: Diagnosis not present

## 2017-06-09 DIAGNOSIS — J329 Chronic sinusitis, unspecified: Secondary | ICD-10-CM | POA: Diagnosis not present

## 2017-06-16 DIAGNOSIS — J329 Chronic sinusitis, unspecified: Secondary | ICD-10-CM | POA: Diagnosis not present

## 2017-06-23 DIAGNOSIS — I201 Angina pectoris with documented spasm: Secondary | ICD-10-CM | POA: Diagnosis not present

## 2017-06-25 DIAGNOSIS — J329 Chronic sinusitis, unspecified: Secondary | ICD-10-CM | POA: Diagnosis not present

## 2017-06-30 DIAGNOSIS — J329 Chronic sinusitis, unspecified: Secondary | ICD-10-CM | POA: Diagnosis not present

## 2017-07-07 DIAGNOSIS — J3089 Other allergic rhinitis: Secondary | ICD-10-CM | POA: Diagnosis not present

## 2017-07-09 DIAGNOSIS — J329 Chronic sinusitis, unspecified: Secondary | ICD-10-CM | POA: Diagnosis not present

## 2017-07-14 DIAGNOSIS — J329 Chronic sinusitis, unspecified: Secondary | ICD-10-CM | POA: Diagnosis not present

## 2017-07-21 DIAGNOSIS — J329 Chronic sinusitis, unspecified: Secondary | ICD-10-CM | POA: Diagnosis not present

## 2017-07-30 DIAGNOSIS — J329 Chronic sinusitis, unspecified: Secondary | ICD-10-CM | POA: Diagnosis not present

## 2017-08-04 DIAGNOSIS — J329 Chronic sinusitis, unspecified: Secondary | ICD-10-CM | POA: Diagnosis not present

## 2017-08-05 DIAGNOSIS — Z6829 Body mass index (BMI) 29.0-29.9, adult: Secondary | ICD-10-CM | POA: Diagnosis not present

## 2017-08-05 DIAGNOSIS — Z01419 Encounter for gynecological examination (general) (routine) without abnormal findings: Secondary | ICD-10-CM | POA: Diagnosis not present

## 2017-08-18 DIAGNOSIS — J309 Allergic rhinitis, unspecified: Secondary | ICD-10-CM | POA: Diagnosis not present

## 2017-09-03 DIAGNOSIS — M450 Ankylosing spondylitis of multiple sites in spine: Secondary | ICD-10-CM | POA: Diagnosis not present

## 2017-09-03 DIAGNOSIS — M25532 Pain in left wrist: Secondary | ICD-10-CM | POA: Diagnosis not present

## 2017-09-03 DIAGNOSIS — M545 Low back pain: Secondary | ICD-10-CM | POA: Diagnosis not present

## 2017-09-03 DIAGNOSIS — Z79899 Other long term (current) drug therapy: Secondary | ICD-10-CM | POA: Diagnosis not present

## 2017-09-15 DIAGNOSIS — J329 Chronic sinusitis, unspecified: Secondary | ICD-10-CM | POA: Diagnosis not present

## 2017-09-24 DIAGNOSIS — J0140 Acute pansinusitis, unspecified: Secondary | ICD-10-CM | POA: Diagnosis not present

## 2017-10-24 DIAGNOSIS — K5900 Constipation, unspecified: Secondary | ICD-10-CM | POA: Diagnosis not present

## 2017-10-24 DIAGNOSIS — M2550 Pain in unspecified joint: Secondary | ICD-10-CM | POA: Diagnosis not present

## 2017-10-24 DIAGNOSIS — E039 Hypothyroidism, unspecified: Secondary | ICD-10-CM | POA: Diagnosis not present

## 2017-10-24 DIAGNOSIS — E569 Vitamin deficiency, unspecified: Secondary | ICD-10-CM | POA: Diagnosis not present

## 2017-10-24 DIAGNOSIS — R5383 Other fatigue: Secondary | ICD-10-CM | POA: Diagnosis not present

## 2017-10-24 DIAGNOSIS — M459 Ankylosing spondylitis of unspecified sites in spine: Secondary | ICD-10-CM | POA: Diagnosis not present

## 2017-10-24 DIAGNOSIS — J302 Other seasonal allergic rhinitis: Secondary | ICD-10-CM | POA: Diagnosis not present

## 2017-10-30 ENCOUNTER — Encounter: Payer: Self-pay | Admitting: Cardiology

## 2017-10-30 ENCOUNTER — Ambulatory Visit: Payer: BLUE CROSS/BLUE SHIELD | Admitting: Cardiology

## 2017-10-30 VITALS — BP 110/71 | HR 72 | Ht 64.0 in | Wt 173.8 lb

## 2017-10-30 DIAGNOSIS — R079 Chest pain, unspecified: Secondary | ICD-10-CM

## 2017-10-30 DIAGNOSIS — Z8249 Family history of ischemic heart disease and other diseases of the circulatory system: Secondary | ICD-10-CM | POA: Diagnosis not present

## 2017-10-30 DIAGNOSIS — I201 Angina pectoris with documented spasm: Secondary | ICD-10-CM

## 2017-10-30 NOTE — Patient Instructions (Addendum)
No medication changes  Your physician wants you to follow-up in: 12 months with Dr. Harding. You will receive a reminder letter in the mail two months in advance. If you don't receive a letter, please call our office to schedule the follow-up appointment.  

## 2017-10-30 NOTE — Progress Notes (Signed)
PCP: [facility]  Clinic Note: No chief complaint on file.   HPI: Ashlee [name] is a 37 y.o. female with a PMH below who presents today for follow-up evaluation of chest discomfortshe also had a recent emergency room visit when I saw her for similar symptoms. She had a coronary CTA done at that time.. She is being seen at the request of [facility]  Ashlee [name] was initially seen on 04/01/2017 when she noted several episodes of prolonged chest discomfort occurring at rest on an associated severe dyspnea. She actually had a near syncopal episode.  Episode followed by a sense of fatigue and exhaustion. Associated with nausea, sweating, and dyspnea with a discomfort described as a chest pressure.  Recent Hospitalizations:   ER visit on July 27 - Coronary Calcium Score and coronary CTA ordered and read that day which reassured her as results were essentially normal. We then went with the diagnosis that these symptoms are most likely related to Prinzmetal Variant Angina --> due to her baseline hypotension, we did not have the ability to use amlodipine, but I did prescribe her Imdur, and sublingual nitroglycerin.   Seen by Dr. Gae Gallop [facility] 06/23/2017: agreed with Dx of Coronary Spasm / Prinzmetal Angina - BP was high enough to add Amlodpine to Imdur.    Studies Personally Reviewed - (if available, images/films reviewed: From Epic Chart or Care Everywhere) No new studies.  Interval History:   [name] returns today actually indicating since we started the Imdur and then the amlodipine was added by the [facility] physicians, she really has had minimal episodes.  She thinks he may have used nitroglycerin twice since October.  The episodes that she has had have not been anywhere near the severity that she they were prior to initiation of treatment.  She actually is in good spirits feeling well.  She is only a little bit upset that she gained some weight  over the last several months.  Stress level has improved, and she is hoping to get back in exercise now.  She is seeing a new holistic medicine PCP who has recommended Nitric Balance (Which is a L-Citrulline donor to help drive production of Nitric Oxide by [employer]).    Remainder of cardiac review of symptoms: No episodes of chest pain or dyspnea with exertion. No PND, orthopnea or edema.  No palpitations, lightheadedness, dizziness, weakness or syncope/near syncope. No TIA/amaurosis fugax symptoms. No claudication.  She is no longer taking Humira for her ankylosing spondylitis to allow her to recuperate from a prolonged episode of the pressure tract infections.  ROS: A comprehensive was performed. Review of Systems  Constitutional: Negative for chills, fever and malaise/fatigue (Sleeping better).  HENT: Negative for congestion and nosebleeds.   Respiratory: Positive for cough. Negative for sputum production, shortness of breath and wheezing.   Cardiovascular: Negative for claudication.  Gastrointestinal: Negative for blood in stool and melena.  Genitourinary: Negative for hematuria.  Musculoskeletal: Positive for back pain (Really chronic pain from her ankylosing spondylitis).  Psychiatric/Behavioral: The patient is nervous/anxious (Notably improved).   All other systems reviewed and are negative.   I have reviewed and (if needed) personally updated the patient's problem list, medications, allergies, past medical and surgical history, social and family history.   Past Medical History:  Diagnosis Date  . Ankylosing spondylitis (HCC)   . Prinzmetal variant angina (HCC) 03/2017   Coronary calcium score was 0 with no evidence of coronary  disease. However prolonged episodes of substernal chest pain relieved with nitroglycerin.    Past Surgical History:  Procedure Laterality Date  . ABLATION  2012  . CESAREAN SECTION  2007, 2012   x 2   . CORONARY CALCIUM SCORE /CTA  03/2017   .  Coronary artery calcium score of 0 Agatston units, suggesting low risk for future cardiac events.  No coronary plaque or stenosis  . NOSE SURGERY    . TONSILLECTOMY  2009  . TUBAL LIGATION  2012    Current Meds  Medication Sig  . amLODipine (NORVASC) 2.5 MG tablet   . HUMIRA PEN 40 MG/0.8ML PNKT Place 40 mg onto the skin every 14 (fourteen) days.   Marland Kitchen ibuprofen (ADVIL,MOTRIN) 200 MG tablet Take 400-800 mg by mouth every 8 (eight) hours as needed for mild pain or moderate pain.   . isosorbide dinitrate (ISORDIL) 30 MG tablet Take 30 mg by mouth.  . isosorbide mononitrate (IMDUR) 30 MG 24 hr tablet Take 1 tablet (30 mg total) by mouth daily.  . Multiple Vitamin (MULTI-VITAMINS) TABS Take by mouth.  . Multiple Vitamin (MULTIVITAMIN) capsule Take 1 capsule by mouth daily.  . nitroGLYCERIN (NITROSTAT) 0.4 MG SL tablet Place 1 tablet (0.4 mg total) under the tongue every 5 (five) minutes as needed for chest pain. MAX 3 doses  . [DISCONTINUED] cetirizine (ZYRTEC) 10 MG tablet Take 10 mg by mouth as needed for allergies.  . [DISCONTINUED] fluconazole (DIFLUCAN) 200 MG tablet TAKE 200mg  BY MOUTH TWICE A WEEK    Allergies  Allergen Reactions  . Wellbutrin [Bupropion] Hives    Social History   Socioeconomic History  . Marital status: Married    Spouse name: None  . Number of children: 3  . Years of education: None  . Highest education level: None  Social Needs  . Financial resource strain: None  . Food insecurity - worry: None  . Food insecurity - inability: None  . Transportation needs - medical: None  . Transportation needs - non-medical: None  Occupational History  . Occupation: Print production planner    Comment: [employer]  Tobacco Use  . Smoking status: Former Smoker    Packs/day: 1.00    Types: E-cigarettes    Last attempt to quit: 03/28/2017    Years since quitting: 0.6  . Smokeless tobacco: Never Used  . Tobacco comment: Quit in 07/2013  Substance and Sexual Activity    . Alcohol use: Yes    Comment: occasionally  . Drug use: No  . Sexual activity: Yes    Birth control/protection: Surgical  Other Topics Concern  . None  Social History Narrative   Married with 3 children   Right handed   12 th grade education   1 cup coffee daily   Work: Technical brewer for [employer]       family history includes Ankylosing spondylitis in her maternal grandmother; Arrhythmia in her paternal grandmother; Cancer in her sister; Cardiomyopathy (age of onset: 38) in her maternal aunt; Diabetes in her paternal grandfather; Heart failure in her maternal grandmother; Hypertension in her father; Kidney cancer in her paternal grandfather; Skin cancer in her maternal grandfather and sister.  Wt Readings from Last 3 Encounters:  10/30/17 173 lb 12.8 oz (78.8 kg)  05/09/17 155 lb 12.8 oz (70.7 kg)  04/01/17 146 lb (66.2 kg)    PHYSICAL EXAM BP 110/71   Pulse 72   Ht 5\' 4"  (1.626 m)   Wt 173 lb 12.8 oz (78.8 kg)  BMI 29.83 kg/m  Physical Exam  Constitutional: She is oriented to person, place, and time. She appears well-developed and well-nourished. No distress.  Well-groomed  HENT:  Head: Normocephalic and atraumatic.  Mouth/Throat: Oropharynx is clear and moist.  Eyes: EOM are normal. Pupils are equal, round, and reactive to light.  Neck: No hepatojugular reflux and no JVD present. Carotid bruit is not present.  Cardiovascular: Normal rate, regular rhythm, normal heart sounds and intact distal pulses. Exam reveals no gallop and no friction rub.  No murmur heard. Pulmonary/Chest: Effort normal and breath sounds normal. No respiratory distress. She has no wheezes. She has no rales. She exhibits no tenderness.  Abdominal: Soft. Bowel sounds are normal. She exhibits no distension. There is no tenderness. There is no rebound.  Musculoskeletal: Normal range of motion. She exhibits no edema or deformity.  Neurological: She is alert and oriented to person, place, and  time.  Skin: She is not diaphoretic.  Psychiatric: She has a normal mood and affect. Her behavior is normal. Judgment and thought content normal.  Nursing note and vitals reviewed.    Adult ECG Report  Rate: 72;  Rhythm: normal sinus rhythm and Normal axis, intervals and durations.;   Narrative Interpretation: Normal EKG  Other studies Reviewed: Additional studies/ records that were reviewed today include:  Recent Labs:   Lab Results  Component Value Date   CREATININE 0.76 04/11/2017   BUN 12 04/11/2017   NA 140 04/11/2017   K 3.8 04/11/2017   CL 110 04/11/2017   CO2 22 04/11/2017   Lab Results  Component Value Date   TROPONINI <0.03 03/26/2017    ASSESSMENT / PLAN: Problem List Items Addressed This Visit    Chest pain with moderate risk for cardiac etiology - Primary    Chest pain episodes are quite consistent with what sounds like coronary spasm.  Controlled with combination of Imdur and amlodipine. Negative coronary calcium score would argue against fixed coronary lesion.  Continue current medications.  Monitor lipids as well.      Relevant Orders   EKG 12-Lead (Completed)   Family history of premature CAD    Reassuring results on coronary CTA.  Monitor lipids and other risk factors.      Prinzmetal variant angina (HCC) (Chronic)    Symptoms have been well controlled now combination of Imdur and amlodipine.  Unable to really titrate amlodipine any further.  I did not start this in the past because of hypotension, but she seems to be tolerating well now.  Still has as needed nitroglycerin. Avoid aspirin because it may trigger episodes.      Relevant Medications   amLODipine (NORVASC) 2.5 MG tablet   isosorbide dinitrate (ISORDIL) 30 MG tablet      Current medicines are reviewed at length with the patient today. (+/- concerns) n/a The following changes have been made: n/a  Patient Instructions  No medication changes   Your physician wants you to  follow-up in 12 months with Dr Herbie BaltimoreHarding. You will receive a reminder letter in the mail two months in advance. If you don't receive a letter, please call our office to schedule the follow-up appointment. Studies Ordered:   Orders Placed This Encounter  Procedures  . EKG 12-Lead      Bryan Lemmaavid Harding, M.D., M.S. Interventional Cardiologist   Pager # [phone] Phone # [phone] [address] [place], [place] [id number]

## 2017-11-02 ENCOUNTER — Encounter: Payer: Self-pay | Admitting: Cardiology

## 2017-11-02 NOTE — Assessment & Plan Note (Signed)
Reassuring results on coronary CTA.  Monitor lipids and other risk factors.

## 2017-11-02 NOTE — Assessment & Plan Note (Signed)
Chest pain episodes are quite consistent with what sounds like coronary spasm.  Controlled with combination of Imdur and amlodipine. Negative coronary calcium score would argue against fixed coronary lesion.  Continue current medications.  Monitor lipids as well.

## 2017-11-02 NOTE — Assessment & Plan Note (Signed)
Symptoms have been well controlled now combination of Imdur and amlodipine.  Unable to really titrate amlodipine any further.  I did not start this in the past because of hypotension, but she seems to be tolerating well now.  Still has as needed nitroglycerin. Avoid aspirin because it may trigger episodes.

## 2017-11-28 DIAGNOSIS — K5900 Constipation, unspecified: Secondary | ICD-10-CM | POA: Diagnosis not present

## 2017-11-28 DIAGNOSIS — E569 Vitamin deficiency, unspecified: Secondary | ICD-10-CM | POA: Diagnosis not present

## 2017-11-28 DIAGNOSIS — M2550 Pain in unspecified joint: Secondary | ICD-10-CM | POA: Diagnosis not present

## 2017-11-28 DIAGNOSIS — R5383 Other fatigue: Secondary | ICD-10-CM | POA: Diagnosis not present

## 2017-12-03 DIAGNOSIS — J019 Acute sinusitis, unspecified: Secondary | ICD-10-CM | POA: Diagnosis not present

## 2017-12-03 DIAGNOSIS — Z6829 Body mass index (BMI) 29.0-29.9, adult: Secondary | ICD-10-CM | POA: Diagnosis not present

## 2017-12-03 DIAGNOSIS — J3489 Other specified disorders of nose and nasal sinuses: Secondary | ICD-10-CM | POA: Diagnosis not present

## 2017-12-03 DIAGNOSIS — Z1389 Encounter for screening for other disorder: Secondary | ICD-10-CM | POA: Diagnosis not present

## 2017-12-03 DIAGNOSIS — E663 Overweight: Secondary | ICD-10-CM | POA: Diagnosis not present

## 2017-12-16 DIAGNOSIS — L03031 Cellulitis of right toe: Secondary | ICD-10-CM | POA: Diagnosis not present

## 2017-12-16 DIAGNOSIS — M79674 Pain in right toe(s): Secondary | ICD-10-CM | POA: Diagnosis not present

## 2018-01-09 DIAGNOSIS — M459 Ankylosing spondylitis of unspecified sites in spine: Secondary | ICD-10-CM | POA: Diagnosis not present

## 2018-01-09 DIAGNOSIS — R5383 Other fatigue: Secondary | ICD-10-CM | POA: Diagnosis not present

## 2018-01-09 DIAGNOSIS — I201 Angina pectoris with documented spasm: Secondary | ICD-10-CM | POA: Diagnosis not present

## 2018-01-09 DIAGNOSIS — K5900 Constipation, unspecified: Secondary | ICD-10-CM | POA: Diagnosis not present

## 2018-01-09 DIAGNOSIS — R6882 Decreased libido: Secondary | ICD-10-CM | POA: Diagnosis not present

## 2018-02-04 ENCOUNTER — Emergency Department (HOSPITAL_COMMUNITY)
Admission: EM | Admit: 2018-02-04 | Discharge: 2018-02-04 | Disposition: A | Discharge status: Home / Self Care | Payer: BLUE CROSS/BLUE SHIELD | Attending: Emergency Medicine | Admitting: Emergency Medicine

## 2018-02-04 ENCOUNTER — Encounter (HOSPITAL_COMMUNITY): Payer: Self-pay | Admitting: Emergency Medicine

## 2018-02-04 ENCOUNTER — Other Ambulatory Visit: Payer: Self-pay

## 2018-02-04 ENCOUNTER — Emergency Department (HOSPITAL_COMMUNITY): Payer: BLUE CROSS/BLUE SHIELD

## 2018-02-04 DIAGNOSIS — S8262XA Displaced fracture of lateral malleolus of left fibula, initial encounter for closed fracture: Secondary | ICD-10-CM

## 2018-02-04 DIAGNOSIS — Y9389 Activity, other specified: Secondary | ICD-10-CM | POA: Insufficient documentation

## 2018-02-04 DIAGNOSIS — Z8719 Personal history of other diseases of the digestive system: Secondary | ICD-10-CM | POA: Insufficient documentation

## 2018-02-04 DIAGNOSIS — Z79899 Other long term (current) drug therapy: Secondary | ICD-10-CM | POA: Diagnosis not present

## 2018-02-04 DIAGNOSIS — Y92810 Car as the place of occurrence of the external cause: Secondary | ICD-10-CM | POA: Diagnosis not present

## 2018-02-04 DIAGNOSIS — Y998 Other external cause status: Secondary | ICD-10-CM | POA: Diagnosis not present

## 2018-02-04 DIAGNOSIS — S99912A Unspecified injury of left ankle, initial encounter: Secondary | ICD-10-CM | POA: Diagnosis present

## 2018-02-04 DIAGNOSIS — W1789XA Other fall from one level to another, initial encounter: Secondary | ICD-10-CM | POA: Insufficient documentation

## 2018-02-04 MED ORDER — PROMETHAZINE HCL 12.5 MG PO TABS
12.5000 mg | ORAL_TABLET | Freq: Once | ORAL | Status: AC
  Administered 2018-02-04: 12.5 mg via ORAL
  Filled 2018-02-04: qty 1

## 2018-02-04 MED ORDER — HYDROCODONE-ACETAMINOPHEN 5-325 MG PO TABS
ORAL_TABLET | ORAL | Status: AC
  Administered 2018-02-04: 1
  Filled 2018-02-04: qty 1

## 2018-02-04 MED ORDER — HYDROCODONE-ACETAMINOPHEN 5-325 MG PO TABS
2.0000 | ORAL_TABLET | Freq: Once | ORAL | Status: AC
Start: 2018-02-04 — End: 2018-02-04
  Administered 2018-02-04: 1 via ORAL
  Filled 2018-02-04: qty 2

## 2018-02-04 MED ORDER — HYDROCODONE-ACETAMINOPHEN 5-325 MG PO TABS: 1.0000 | ORAL_TABLET | ORAL | 0 refills | Status: DC | PRN

## 2018-02-04 NOTE — Discharge Instructions (Addendum)
You have a fracture of 1 of the bones in your left ankle known as the fibula.  Please use your cam walker when up and about.  Please use crutches until seen by the orthopedic specialist.  Apply ice, and keep your foot elevated as much as possible.  Use Tylenol extra strength for mild pain.  Use Norco for more severe pain.  Norco may cause drowsiness.  Please do not drive, operate machinery, drink alcohol, and illegal documents, or participate in activities requiring concentration when taking this medication.  Please call Dr. Romeo Apple on tomorrow to set up an appointment concerning your fracture as soon as possible.

## 2018-02-04 NOTE — ED Triage Notes (Signed)
Step out of car this evening and rolled left ankle. " I heard it pop"  Swelling noted

## 2018-02-04 NOTE — ED Notes (Signed)
Pt left with husband. Husband was driving

## 2018-02-04 NOTE — ED Notes (Signed)
Pt does not want ice on ankle.

## 2018-02-04 NOTE — ED Notes (Signed)
Pt did NOT want second Vicodin

## 2018-02-04 NOTE — ED Provider Notes (Addendum)
[facility] EMERGENCY DEPARTMENT Provider Note   CSN: [id number] Arrival date & time: 02/04/18  1828     History   Chief Complaint Chief Complaint  Patient presents with  . Ankle Pain    HPI Ashlee [name] is a 37 y.o. female.  Patient is a 37 year old female who presents to the emergency department with a complaint of left ankle pain.  The patient states she was stepping out of the car, she stepped in a hole, and rolled her ankle.  During the course of this she heard and felt a pop.  Following this she noted swelling of the side of her ankle and has had pain that is been increasing since that time.  No previous operations or procedures involving the left lower extremity.  The patient denies being on any anticoagulation medications.  No previous injury to the ankle.  She presents now for assistance with her pain and for evaluation.  No other injury reported.     Past Medical History:  Diagnosis Date  . Ankylosing spondylitis (HCC)   . Prinzmetal variant angina (HCC) 03/2017   Coronary calcium score was 0 with no evidence of coronary disease. However prolonged episodes of substernal chest pain relieved with nitroglycerin.    Patient Active Problem List   Diagnosis Date Noted  . Prinzmetal variant angina (HCC) 05/09/2017  . Chest pain with moderate risk for cardiac etiology 04/01/2017  . Family history of premature CAD 04/01/2017  . Paresthesias 02/14/2014    Past Surgical History:  Procedure Laterality Date  . ABLATION  2012  . CESAREAN SECTION  2007, 2012   x 2   . CORONARY CALCIUM SCORE /CTA  03/2017   . Coronary artery calcium score of 0 Agatston units, suggesting low risk for future cardiac events.  No coronary plaque or stenosis  . NOSE SURGERY    . TONSILLECTOMY  2009  . TUBAL LIGATION  2012     OB History   None      Home Medications    Prior to Admission medications   Medication Sig Start Date End Date Taking? Authorizing Provider  amLODipine  (NORVASC) 2.5 MG tablet  10/14/17   [provider]  HUMIRA PEN 40 MG/0.8ML PNKT Place 40 mg onto the skin every 14 (fourteen) days.  03/10/17   [provider]  ibuprofen (ADVIL,MOTRIN) 200 MG tablet Take 400-800 mg by mouth every 8 (eight) hours as needed for mild pain or moderate pain.     [provider]  isosorbide dinitrate (ISORDIL) 30 MG tablet Take 30 mg by mouth.    [provider]  isosorbide mononitrate (IMDUR) 30 MG 24 hr tablet Take 1 tablet (30 mg total) by mouth daily. 04/01/17 10/30/17  Marykay Lex, MD  Multiple Vitamin (MULTI-VITAMINS) TABS Take by mouth.    [provider]  Multiple Vitamin (MULTIVITAMIN) capsule Take 1 capsule by mouth daily.    [provider]  nitroGLYCERIN (NITROSTAT) 0.4 MG SL tablet Place 1 tablet (0.4 mg total) under the tongue every 5 (five) minutes as needed for chest pain. MAX 3 doses 04/01/17 10/30/17  Marykay Lex, MD    Family History Family History  Problem Relation Age of Onset  . Hypertension Father   . Skin cancer Sister   . Heart failure Maternal Grandmother   . Ankylosing spondylitis Maternal Grandmother        Her mother also had along with 4 sisters  . Skin cancer Maternal Grandfather   .  Arrhythmia Paternal Grandmother   . Diabetes Paternal Grandfather   . Kidney cancer Paternal Grandfather   . Cancer Sister   . Cardiomyopathy Maternal Aunt 45       With LBBB    Social History Social History   Tobacco Use  . Smoking status: Former Smoker    Packs/day: 1.00    Types: E-cigarettes    Last attempt to quit: 03/28/2017    Years since quitting: 0.8  . Smokeless tobacco: Never Used  . Tobacco comment: Quit in 07/2013  Substance Use Topics  . Alcohol use: Yes    Comment: occasionally  . Drug use: No     Allergies   Wellbutrin [bupropion]   Review of Systems Review of Systems  Constitutional: Negative for activity change.       All ROS Neg except as noted in HPI   HENT: Negative for nosebleeds.   Eyes: Negative for photophobia and discharge.  Respiratory: Negative for cough, shortness of breath and wheezing.   Cardiovascular: Negative for chest pain and palpitations.  Gastrointestinal: Negative for abdominal pain and blood in stool.  Genitourinary: Negative for dysuria, frequency and hematuria.  Musculoskeletal: Positive for arthralgias. Negative for back pain and neck pain.       Ankle pain  Skin: Negative.   Neurological: Negative for dizziness, seizures and speech difficulty.  Psychiatric/Behavioral: Negative for confusion and hallucinations.     Physical Exam Updated Vital Signs BP 123/73 (BP Location: Right Arm)   Pulse 84   Temp 98.4 F (36.9 C) (Oral)   Resp 20   Ht  (1.626 m)   Wt 77.1 kg (170 lb)   SpO2 99%   BMI 29.18 kg/m   Physical Exam  Constitutional: She is oriented to person, place, and time. She appears well-developed and well-nourished.  Non-toxic appearance.  HENT:  Head: Normocephalic.  Right Ear: Tympanic membrane and external ear normal.  Left Ear: Tympanic membrane and external ear normal.  Eyes: Pupils are equal, round, and reactive to light. EOM and lids are normal.  Neck: Normal range of motion. Neck supple. Carotid bruit is not present.  Cardiovascular: Normal rate, regular rhythm, normal heart sounds, intact distal pulses and normal pulses.  Pulmonary/Chest: Breath sounds normal. No respiratory distress.  Abdominal: Soft. Bowel sounds are normal. There is no tenderness. There is no guarding.  Musculoskeletal: She exhibits tenderness.       Left ankle: She exhibits decreased range of motion and swelling. Tenderness. Lateral malleolus tenderness found.       Feet:  There is full range of motion of the left hip and knee.  There is no pain or swelling involving the calf.  The Achilles tendon on the left is intact.  There is a swelling of the lateral malleolus.  The dorsalis pedis and posterior tibial  pulses are 2+.  There is full range of motion of the toes.  Capillary refill on the left is less than 2 seconds.  No pain or evidence of injury to the right lower extremity.  Lymphadenopathy:       Head (right side): No submandibular adenopathy present.       Head (left side): No submandibular adenopathy present.    She has no cervical adenopathy.  Neurological: She is alert and oriented to person, place, and time. She has normal strength. No cranial nerve deficit or sensory deficit.  Skin: Skin is warm and dry.  Psychiatric: She has a normal mood and affect. Her speech is normal.  Nursing note and vitals reviewed.    ED Treatments / Results  Labs (all labs ordered are listed, but only abnormal results are displayed) Labs Reviewed - No data to display  EKG None  Radiology No results found.  Procedures Procedures (including critical care time)  FRACTURE CARE LEFT ANKLE Patient is a 37 year old female who presents to the emergency department with severe ankle pain.  The patient stepped in hole, heard and felt a pop.  This was accompanied by swelling of the lateral malleolus.  X-ray reveals a lateral malleolus fracture.  I explained the fracture to the patient in terms of which she understands.  She is in agreement with the procedure for immobilization.  Patient identified by armband.  Procedural timeout taken.  Patient fitted with a cam walker to the left lower extremity.  Crutches were offered, but the patient refuses the crutches at this time.  She states she has crutches at home.  Patient treated in the emergency department with narcotic pain medication.  The patient tolerated the procedure without problem. Medications Ordered in ED Medications - No data to display   Initial Impression / Assessment and Plan / ED Course  I have reviewed the triage vital signs and the nursing notes.  Pertinent labs & imaging results that were available during my care of the patient were  reviewed by me and considered in my medical decision making (see chart for details).       Final Clinical Impressions(s) / ED Diagnoses MDM  Vital signs within normal limits.  Pulse oximetry is 99% on room air.  Within normal limits by my interpretation.  Patient having a great deal of pain.  Ice pack has been provided.  Will obtain x-ray.  Patient will be treated with oral narcotic pain medication.  X-ray reveals a fracture of the lateral malleolus on the left.  I have informed the patient of the fracture in terms of which she understands.  Patient will be treated with oral narcotic pain medication.  Patient will be fitted with a Cam walker.  Crutches were offered, but the family states that they have crutches at home and they declined the crutches here.  Patient referred to orthopedics for additional management.   Final diagnoses:  Closed low lateral malleolus fracture, left, initial encounter    ED Discharge Orders        Ordered    HYDROcodone-acetaminophen (NORCO/VICODIN) 5-325 MG tablet  Every 4 hours PRN     02/04/18 1949    HYDROcodone-acetaminophen (NORCO/VICODIN) 5-325 MG tablet  Every 4 hours PRN     02/04/18 1949       Ivery Quale, PA-C 02/04/18 1959    Ivery Quale, PA-C 02/04/18 2019    Terrilee Files, MD 02/05/18 [phone]

## 2018-02-05 ENCOUNTER — Telehealth: Payer: Self-pay | Admitting: Orthopedic Surgery

## 2018-02-05 DIAGNOSIS — M25572 Pain in left ankle and joints of left foot: Secondary | ICD-10-CM | POA: Diagnosis not present

## 2018-02-05 NOTE — Telephone Encounter (Signed)
Patient called to see if she could see Dr. Romeo Apple today. I told her that Dr. Romeo Apple was not in the office today, but I could get her in with him tomorrow.  I told her that we had Dr. Hilda Lias, but I need to speak with him for a same day appointment. She stated that was ok, she has an appointment already in [place], and she will just keep that one.

## 2018-02-07 MED FILL — Hydrocodone-Acetaminophen Tab 5-325 MG: ORAL | Qty: 6 | Status: AC

## 2018-02-12 DIAGNOSIS — M25572 Pain in left ankle and joints of left foot: Secondary | ICD-10-CM | POA: Diagnosis not present

## 2018-02-17 DIAGNOSIS — M25572 Pain in left ankle and joints of left foot: Secondary | ICD-10-CM | POA: Diagnosis not present

## 2018-02-17 DIAGNOSIS — S82831A Other fracture of upper and lower end of right fibula, initial encounter for closed fracture: Secondary | ICD-10-CM | POA: Diagnosis not present

## 2018-02-25 ENCOUNTER — Other Ambulatory Visit (HOSPITAL_COMMUNITY): Payer: Self-pay | Admitting: Specialist

## 2018-02-25 ENCOUNTER — Ambulatory Visit (HOSPITAL_COMMUNITY)
Admission: RE | Admit: 2018-02-25 | Discharge: 2018-02-25 | Disposition: A | Discharge status: Home / Self Care | Payer: BLUE CROSS/BLUE SHIELD | Source: Ambulatory Visit | Attending: Cardiology | Admitting: Cardiology

## 2018-02-25 DIAGNOSIS — M79662 Pain in left lower leg: Secondary | ICD-10-CM

## 2018-02-25 DIAGNOSIS — S82402D Unspecified fracture of shaft of left fibula, subsequent encounter for closed fracture with routine healing: Secondary | ICD-10-CM | POA: Diagnosis not present

## 2018-02-25 DIAGNOSIS — R600 Localized edema: Secondary | ICD-10-CM | POA: Insufficient documentation

## 2018-02-25 DIAGNOSIS — M7989 Other specified soft tissue disorders: Secondary | ICD-10-CM

## 2018-02-25 DIAGNOSIS — M79672 Pain in left foot: Secondary | ICD-10-CM | POA: Diagnosis not present

## 2018-03-12 DIAGNOSIS — S82402D Unspecified fracture of shaft of left fibula, subsequent encounter for closed fracture with routine healing: Secondary | ICD-10-CM | POA: Diagnosis not present

## 2018-04-01 DIAGNOSIS — E663 Overweight: Secondary | ICD-10-CM | POA: Diagnosis not present

## 2018-04-01 DIAGNOSIS — Z6829 Body mass index (BMI) 29.0-29.9, adult: Secondary | ICD-10-CM | POA: Diagnosis not present

## 2018-04-01 DIAGNOSIS — Z1389 Encounter for screening for other disorder: Secondary | ICD-10-CM | POA: Diagnosis not present

## 2018-04-01 DIAGNOSIS — L568 Other specified acute skin changes due to ultraviolet radiation: Secondary | ICD-10-CM | POA: Diagnosis not present

## 2018-04-06 DIAGNOSIS — S82402D Unspecified fracture of shaft of left fibula, subsequent encounter for closed fracture with routine healing: Secondary | ICD-10-CM | POA: Diagnosis not present

## 2018-04-16 DIAGNOSIS — M450 Ankylosing spondylitis of multiple sites in spine: Secondary | ICD-10-CM | POA: Diagnosis not present

## 2018-04-16 DIAGNOSIS — M545 Low back pain: Secondary | ICD-10-CM | POA: Diagnosis not present

## 2018-04-16 DIAGNOSIS — Z79899 Other long term (current) drug therapy: Secondary | ICD-10-CM | POA: Diagnosis not present

## 2018-04-28 ENCOUNTER — Other Ambulatory Visit: Payer: Self-pay | Admitting: Cardiology

## 2018-06-16 DIAGNOSIS — R079 Chest pain, unspecified: Secondary | ICD-10-CM | POA: Diagnosis not present

## 2018-08-06 DIAGNOSIS — Z01419 Encounter for gynecological examination (general) (routine) without abnormal findings: Secondary | ICD-10-CM | POA: Diagnosis not present

## 2018-08-06 DIAGNOSIS — Z6831 Body mass index (BMI) 31.0-31.9, adult: Secondary | ICD-10-CM | POA: Diagnosis not present

## 2018-08-15 IMAGING — US US PELVIS COMPLETE
1 series · 13 of 25 positions shown · non-contrast
Comparison: CT on 07/14/2014

CLINICAL DATA: Right pelvic pain beginning this morning. Previous
endometrial ablation and bilateral tubal ligation.

EXAM:
TRANSABDOMINAL AND TRANSVAGINAL ULTRASOUND OF PELVIS
TECHNIQUE: Both transabdominal and transvaginal ultrasound examinations of the
pelvis were performed. Transabdominal technique was performed for
global imaging of the pelvis including uterus, ovaries, adnexal
regions, and pelvic cul-de-sac. It was necessary to proceed with
endovaginal exam following the transabdominal exam to visualize the
endometrium and ovaries.

[Series 1: us pelvis complete · 0.19mm/px · 13 of 97 slices shown]
[im 1/97]
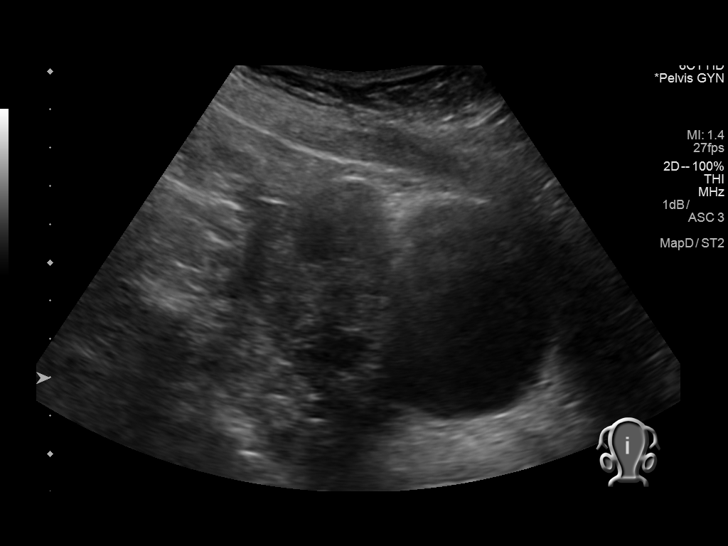
[im 9/97]
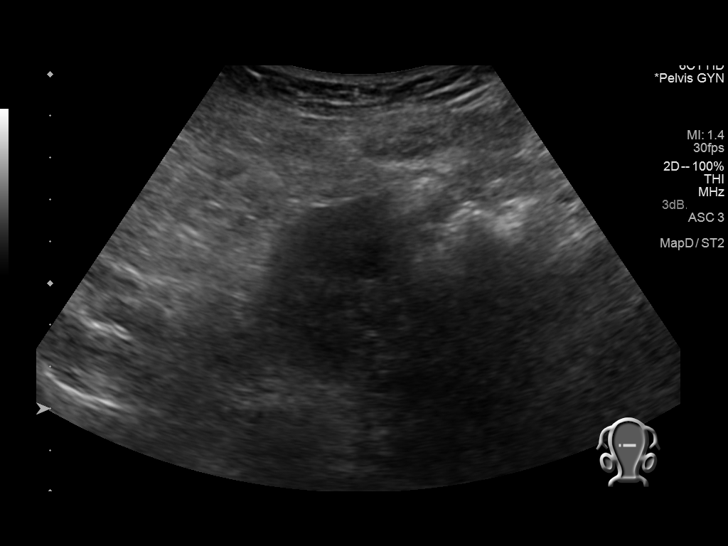
[im 17/97]
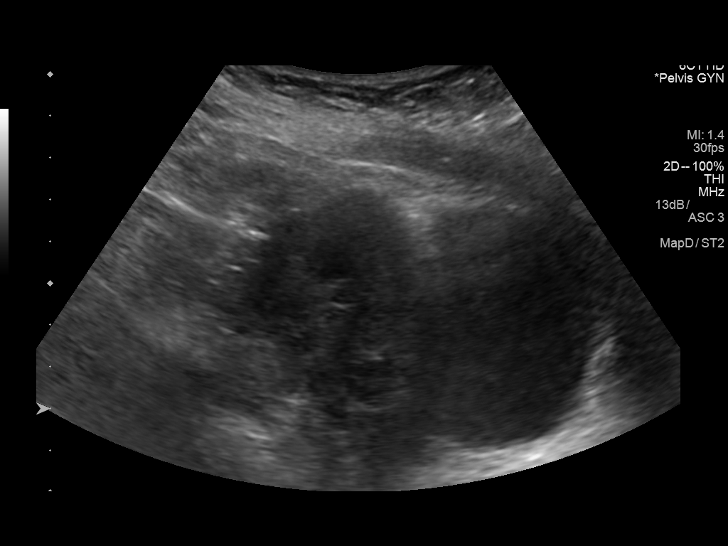
[im 25/97]
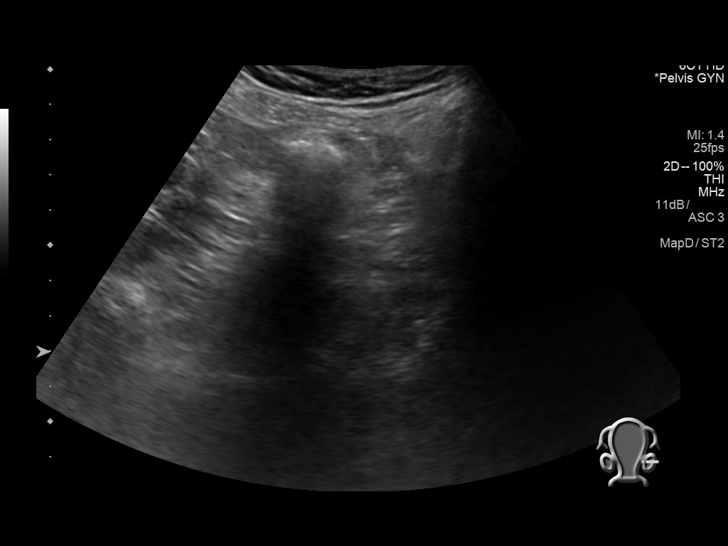
[im 33/97]
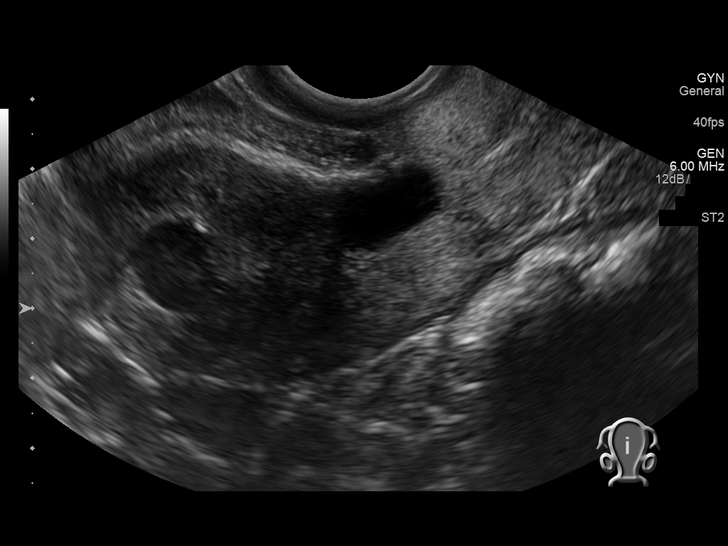
[im 41/97]
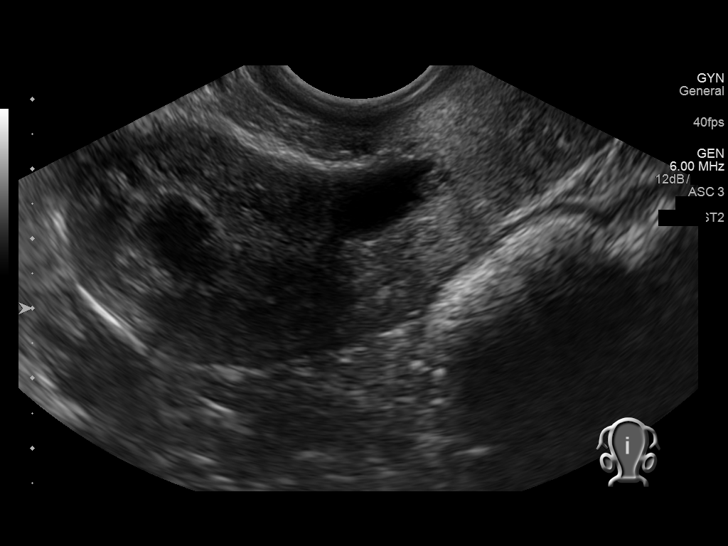
[im 49/97]
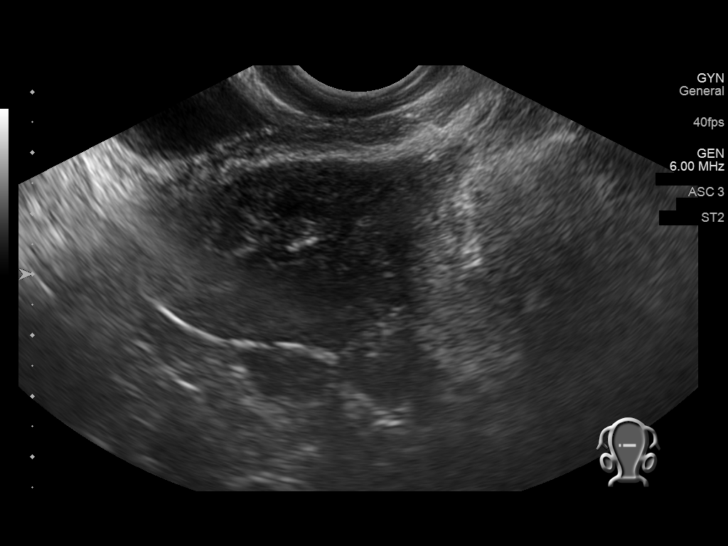
[im 57/97]
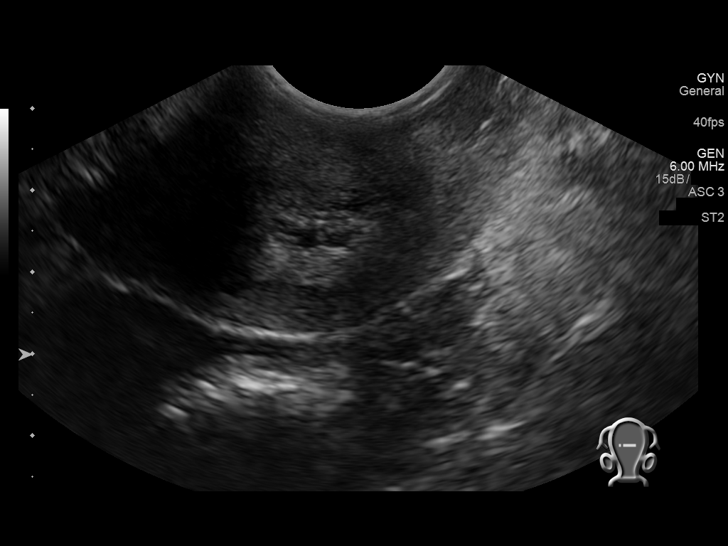
[im 65/97]
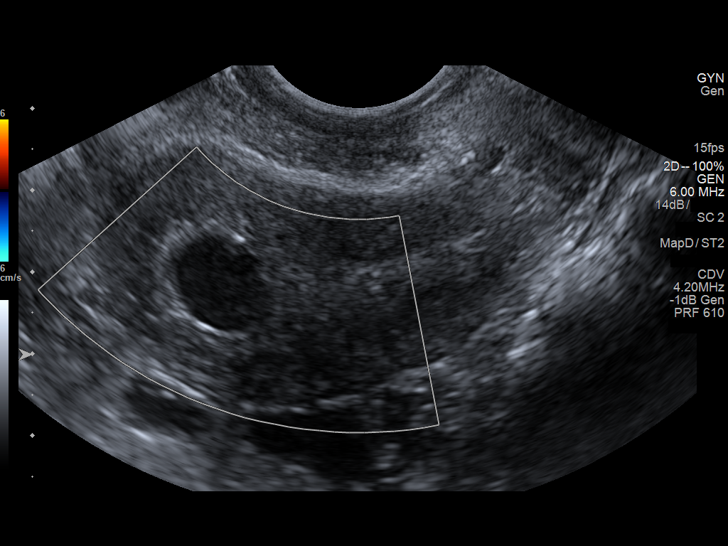
[im 73/97]
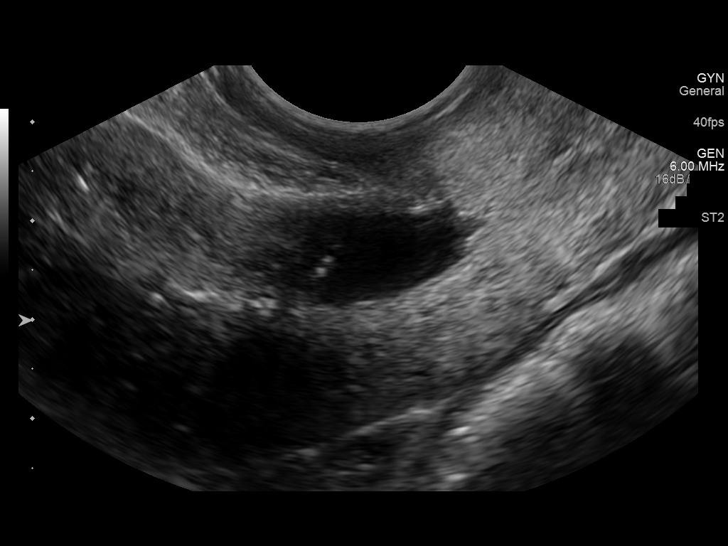
[im 81/97]
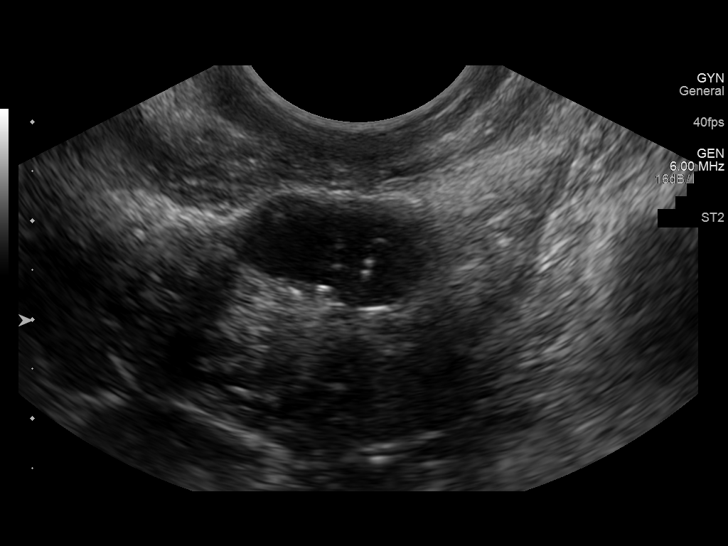
[im 89/97]
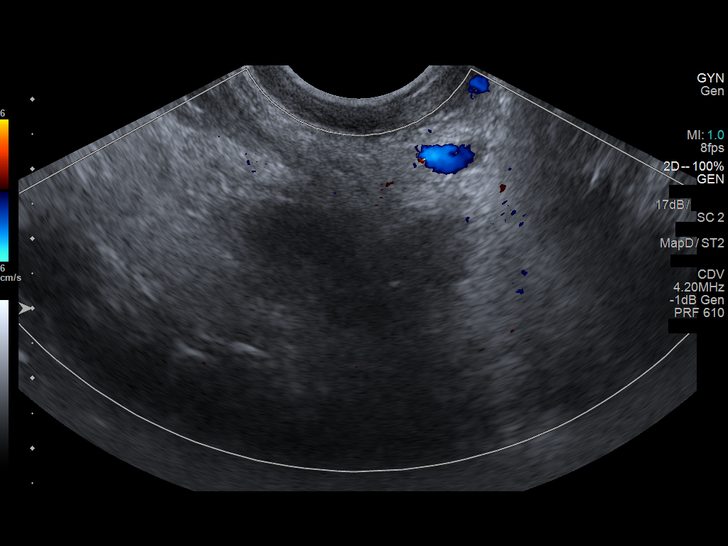
[im 97/97]
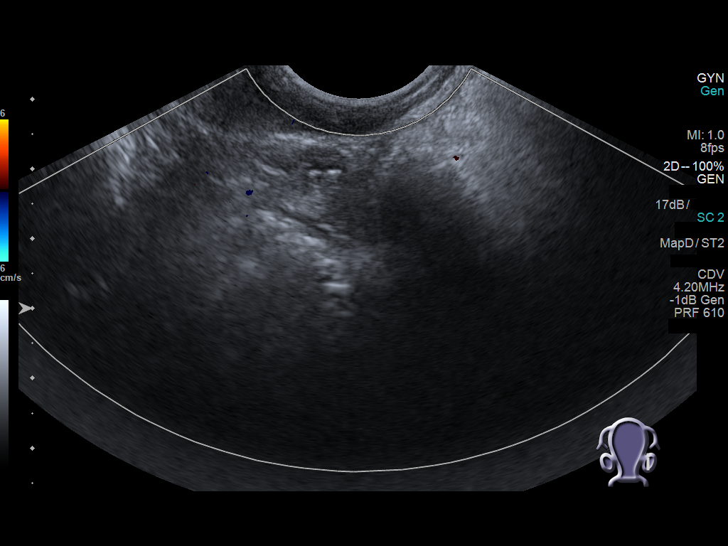

[13 of 25 positions shown; findings below may reference images not displayed]

FINDINGS: Uterus

Measurements: 8.6 x 3.6 x 5.0 cm. No fibroids or other mass
visualized.

Endometrium

Thickness: Difficult to measure due to ill-defined endometrial
myometrial junction. Calcifications seen within the endometrium. A
rounded fluid collection is seen in the fundal portion of the
endometrial cavity measuring 1.4 cm, and a second similar-appearing
fluid collection is seen in the lower uterine segment measuring
cm. These findings are not significantly changed compared to
previous CT in 8979, and are consistent with hydrometros secondary
to previous endometrial ablation.

Right ovary

Measurements: Not directly visualized, however no adnexal mass
identified.

Left ovary

Measurements: Not directly visualized, however no adnexal mass
identified.

Other findings

No abnormal free fluid.
IMPRESSION: Two rounded fluid collections within endometrial cavity. The show no
significant change since 8979 CT, and are consistent with
hydrometros secondary to previous endometrial ablation.

Nonvisualization of ovaries, however no adnexal mass or other acute
findings identified.

## 2018-09-18 DIAGNOSIS — M450 Ankylosing spondylitis of multiple sites in spine: Secondary | ICD-10-CM | POA: Diagnosis not present

## 2018-09-18 DIAGNOSIS — Z79899 Other long term (current) drug therapy: Secondary | ICD-10-CM | POA: Diagnosis not present

## 2018-09-18 DIAGNOSIS — M545 Low back pain: Secondary | ICD-10-CM | POA: Diagnosis not present

## 2018-10-29 ENCOUNTER — Emergency Department (HOSPITAL_COMMUNITY)
Admission: EM | Admit: 2018-10-29 | Discharge: 2018-10-29 | Disposition: A | Discharge status: Home / Self Care | Payer: BLUE CROSS/BLUE SHIELD | Attending: Emergency Medicine | Admitting: Emergency Medicine

## 2018-10-29 ENCOUNTER — Other Ambulatory Visit: Payer: Self-pay

## 2018-10-29 ENCOUNTER — Encounter (HOSPITAL_COMMUNITY): Payer: Self-pay | Admitting: Emergency Medicine

## 2018-10-29 DIAGNOSIS — Z79899 Other long term (current) drug therapy: Secondary | ICD-10-CM | POA: Diagnosis not present

## 2018-10-29 DIAGNOSIS — J0100 Acute maxillary sinusitis, unspecified: Secondary | ICD-10-CM | POA: Diagnosis not present

## 2018-10-29 DIAGNOSIS — J029 Acute pharyngitis, unspecified: Secondary | ICD-10-CM | POA: Diagnosis present

## 2018-10-29 DIAGNOSIS — B9789 Other viral agents as the cause of diseases classified elsewhere: Secondary | ICD-10-CM | POA: Diagnosis not present

## 2018-10-29 DIAGNOSIS — J028 Acute pharyngitis due to other specified organisms: Secondary | ICD-10-CM | POA: Diagnosis not present

## 2018-10-29 DIAGNOSIS — F17290 Nicotine dependence, other tobacco product, uncomplicated: Secondary | ICD-10-CM | POA: Diagnosis not present

## 2018-10-29 LAB — GROUP A STREP BY PCR: GROUP A STREP BY PCR: NOT DETECTED

## 2018-10-29 MED ORDER — AMOXICILLIN-POT CLAVULANATE 875-125 MG PO TABS: 1.0000 | ORAL_TABLET | Freq: Two times a day (BID) | ORAL | 0 refills | Status: DC

## 2018-10-29 MED ORDER — DEXAMETHASONE 4 MG PO TABS
8.0000 mg | ORAL_TABLET | Freq: Once | ORAL | Status: AC
  Administered 2018-10-29: 8 mg via ORAL
  Filled 2018-10-29: qty 2

## 2018-10-29 NOTE — ED Triage Notes (Signed)
Pt C/O sore throat that began this morning. Pt states that when she is lying down it feel like it is restricting her airway. Tonsils are red and swollen. No stridor noted. Pt in NAD.

## 2018-10-29 NOTE — ED Provider Notes (Signed)
[facility]Y [facility] Provider Note   CSN: [id number] Arrival date & time: 10/29/18  0534     History   Chief Complaint Chief Complaint  Patient presents with  . Sore Throat    HPI Ashlee [name] is a 38 y.o. female.  The history is provided by the patient.  Sore Throat  This is a new problem. The current episode started 3 to 5 hours ago. The problem occurs constantly. The problem has been rapidly worsening. Pertinent negatives include no chest pain and no shortness of breath. The symptoms are aggravated by swallowing. The symptoms are relieved by rest.  Patient reports she has had a sinus infection for up to a week and a half.  She is been treating this with over-the-counter medicines.  Tonight she woke up and had significant sore throat and felt that her throat was swelling.  No fevers or vomiting.  No cough.  No chest pain or shortness of breath.  Past Medical History:  Diagnosis Date  . Ankylosing spondylitis (HCC)   . Prinzmetal variant angina (HCC) 03/2017   Coronary calcium score was 0 with no evidence of coronary disease. However prolonged episodes of substernal chest pain relieved with nitroglycerin.    Patient Active Problem List   Diagnosis Date Noted  . Prinzmetal variant angina (HCC) 05/09/2017  . Chest pain with moderate risk for cardiac etiology 04/01/2017  . Family history of premature CAD 04/01/2017  . Paresthesias 02/14/2014    Past Surgical History:  Procedure Laterality Date  . ABLATION  2012  . CESAREAN SECTION  2007, 2012   x 2   . CORONARY CALCIUM SCORE /CTA  03/2017   . Coronary artery calcium score of 0 Agatston units, suggesting low risk for future cardiac events.  No coronary plaque or stenosis  . NOSE SURGERY    . TONSILLECTOMY  2009  . TUBAL LIGATION  2012     OB History   No obstetric history on file.      Home Medications    Prior to Admission medications   Medication Sig Start Date End Date Taking? Authorizing  Provider  amLODipine (NORVASC) 2.5 MG tablet  10/14/17   [provider]  HUMIRA PEN 40 MG/0.8ML PNKT Place 40 mg onto the skin every 14 (fourteen) days.  03/10/17   [provider]  HYDROcodone-acetaminophen (NORCO/VICODIN) 5-325 MG tablet Take 1-2 tablets by mouth every 4 (four) hours as needed. 02/04/18   Ivery QualeBryant, Hobson, PA-C  HYDROcodone-acetaminophen (NORCO/VICODIN) 5-325 MG tablet Take 1 tablet by mouth every 4 (four) hours as needed. 02/04/18   Ivery QualeBryant, Hobson, PA-C  ibuprofen (ADVIL,MOTRIN) 200 MG tablet Take 400-800 mg by mouth every 8 (eight) hours as needed for mild pain or moderate pain.     [provider]  isosorbide dinitrate (ISORDIL) 30 MG tablet Take 30 mg by mouth.    [provider]  isosorbide mononitrate (IMDUR) 30 MG 24 hr tablet TAKE ONE (1) TABLET BY MOUTH EVERY DAY 04/28/18   Marykay LexHarding, David W, MD  Multiple Vitamin (MULTI-VITAMINS) TABS Take by mouth.    [provider]  Multiple Vitamin (MULTIVITAMIN) capsule Take 1 capsule by mouth daily.    [provider]  nitroGLYCERIN (NITROSTAT) 0.4 MG SL tablet PLACE ONE TABLET UNDER THE TONGUE EVERY FIVE MINUTES AS NEEDED FOR CHEST PAIN. MAXIMUM DOSES OF THREE TABLETS. 04/28/18   Marykay LexHarding, David W, MD    Family History Family History  Problem Relation Age of Onset  . Hypertension  Father   . Skin cancer Sister   . Heart failure Maternal Grandmother   . Ankylosing spondylitis Maternal Grandmother        Her mother also had along with 4 sisters  . Skin cancer Maternal Grandfather   . Arrhythmia Paternal Grandmother   . Diabetes Paternal Grandfather   . Kidney cancer Paternal Grandfather   . Cancer Sister   . Cardiomyopathy Maternal Aunt 45       With LBBB    Social History Social History   Tobacco Use  . Smoking status: Former Smoker    Packs/day: 1.00    Types: E-cigarettes    Last attempt to quit: 03/28/2017    Years since quitting: 1.5  . Smokeless tobacco: Never  Used  . Tobacco comment: Quit in 07/2013  Substance Use Topics  . Alcohol use: Yes    Comment: occasionally  . Drug use: No     Allergies   Wellbutrin [bupropion]   Review of Systems Review of Systems  Constitutional: Negative for fever.  HENT: Positive for sore throat.   Respiratory: Negative for shortness of breath.   Cardiovascular: Negative for chest pain.  Gastrointestinal: Negative for vomiting.  All other systems reviewed and are negative.    Physical Exam Updated Vital Signs BP 125/75 (BP Location: Left Arm)   Pulse (!) 107   Temp 98.3 F (36.8 C) (Oral)   Resp 16   SpO2 98%   Physical Exam CONSTITUTIONAL: Well developed/well nourished HEAD: Normocephalic/atraumatic EYES: EOMI/PERRL ENMT: Mucous membranes moist, uvula midline with erythema, mild edema.  No exudates.  No drooling, no stridor, no dysphonia. NECK: supple no meningeal signs mild cervical lymphadenopathy CV: S1/S2 noted, no murmurs/rubs/gallops noted LUNGS: Lungs are clear to auscultation bilaterally, no apparent distress ABDOMEN: soft NEURO: Pt is awake/alert/appropriate, moves all extremitiesx4.  No facial droop.   EXTREMITIES: pulses normal/equal, full ROM SKIN: warm, color normal PSYCH: no abnormalities of mood noted, alert and oriented to situation   ED Treatments / Results  Labs (all labs ordered are listed, but only abnormal results are displayed) Labs Reviewed  GROUP A STREP BY PCR    EKG None  Radiology No results found.  Procedures Procedures (including critical care time)  Medications Ordered in ED Medications  dexamethasone (DECADRON) tablet 8 mg (8 mg Oral Given 10/29/18 [id number])     Initial Impression / Assessment and Plan / ED Course  I have reviewed the triage vital signs and the nursing notes.  Pertinent labs  results that were available during my care of the patient were reviewed by me and considered in my medical decision making (see chart for details).      Pt reports abrupt onset of sore throat No stridor, minimal swelling in OP She is in no distress Taking PO fluids Decadron from symptom control She reports sinusitis symptoms >1.5 weeks despite aggressive treatment at home Will start antibiotics - reports augmentin works for her   Final Clinical Impressions(s) / ED Diagnoses   Final diagnoses:  Viral pharyngitis  Acute non-recurrent maxillary sinusitis    ED Discharge Orders         Ordered    amoxicillin-clavulanate (AUGMENTIN) 875-125 MG tablet  2 times daily     10/29/18 0641           Zadie RhineWickline, Phelicia Dantes, MD 10/29/18 [phone]

## 2018-11-23 DIAGNOSIS — D485 Neoplasm of uncertain behavior of skin: Secondary | ICD-10-CM | POA: Diagnosis not present

## 2018-11-23 DIAGNOSIS — L820 Inflamed seborrheic keratosis: Secondary | ICD-10-CM | POA: Diagnosis not present

## 2018-11-23 DIAGNOSIS — D239 Other benign neoplasm of skin, unspecified: Secondary | ICD-10-CM | POA: Diagnosis not present

## 2018-11-23 DIAGNOSIS — L709 Acne, unspecified: Secondary | ICD-10-CM | POA: Diagnosis not present

## 2018-11-23 DIAGNOSIS — L570 Actinic keratosis: Secondary | ICD-10-CM | POA: Diagnosis not present

## 2018-11-23 DIAGNOSIS — D225 Melanocytic nevi of trunk: Secondary | ICD-10-CM | POA: Diagnosis not present

## 2018-11-23 DIAGNOSIS — D2262 Melanocytic nevi of left upper limb, including shoulder: Secondary | ICD-10-CM | POA: Diagnosis not present

## 2018-11-23 DIAGNOSIS — B078 Other viral warts: Secondary | ICD-10-CM | POA: Diagnosis not present

## 2018-12-28 DIAGNOSIS — R079 Chest pain, unspecified: Secondary | ICD-10-CM | POA: Diagnosis not present

## 2018-12-28 DIAGNOSIS — I201 Angina pectoris with documented spasm: Secondary | ICD-10-CM | POA: Diagnosis not present

## 2019-03-15 DIAGNOSIS — F068 Other specified mental disorders due to known physiological condition: Secondary | ICD-10-CM | POA: Diagnosis not present

## 2019-03-16 DIAGNOSIS — R0789 Other chest pain: Secondary | ICD-10-CM | POA: Diagnosis not present

## 2019-03-16 DIAGNOSIS — R51 Headache: Secondary | ICD-10-CM | POA: Diagnosis not present

## 2019-03-16 DIAGNOSIS — R05 Cough: Secondary | ICD-10-CM | POA: Diagnosis not present

## 2019-03-25 DIAGNOSIS — M450 Ankylosing spondylitis of multiple sites in spine: Secondary | ICD-10-CM | POA: Diagnosis not present

## 2019-03-25 DIAGNOSIS — Z79899 Other long term (current) drug therapy: Secondary | ICD-10-CM | POA: Diagnosis not present

## 2019-03-25 DIAGNOSIS — M545 Low back pain: Secondary | ICD-10-CM | POA: Diagnosis not present

## 2019-06-29 DIAGNOSIS — M545 Low back pain: Secondary | ICD-10-CM | POA: Diagnosis not present

## 2019-06-29 DIAGNOSIS — R5382 Chronic fatigue, unspecified: Secondary | ICD-10-CM | POA: Diagnosis not present

## 2019-06-29 DIAGNOSIS — Z79899 Other long term (current) drug therapy: Secondary | ICD-10-CM | POA: Diagnosis not present

## 2019-06-29 DIAGNOSIS — M450 Ankylosing spondylitis of multiple sites in spine: Secondary | ICD-10-CM | POA: Diagnosis not present

## 2019-08-30 DIAGNOSIS — J019 Acute sinusitis, unspecified: Secondary | ICD-10-CM | POA: Diagnosis not present

## 2019-09-06 DIAGNOSIS — Z79899 Other long term (current) drug therapy: Secondary | ICD-10-CM | POA: Diagnosis not present

## 2019-09-06 DIAGNOSIS — R300 Dysuria: Secondary | ICD-10-CM | POA: Diagnosis not present

## 2019-09-06 DIAGNOSIS — M545 Low back pain: Secondary | ICD-10-CM | POA: Diagnosis not present

## 2019-09-06 DIAGNOSIS — M450 Ankylosing spondylitis of multiple sites in spine: Secondary | ICD-10-CM | POA: Diagnosis not present

## 2019-10-25 ENCOUNTER — Ambulatory Visit: Payer: BC Managed Care – PPO | Attending: Internal Medicine

## 2019-10-25 ENCOUNTER — Other Ambulatory Visit: Payer: Self-pay

## 2019-10-25 DIAGNOSIS — Z20822 Contact with and (suspected) exposure to covid-19: Secondary | ICD-10-CM | POA: Insufficient documentation

## 2019-10-26 LAB — NOVEL CORONAVIRUS, NAA: SARS-CoV-2, NAA: NOT DETECTED

## 2019-12-21 DIAGNOSIS — L814 Other melanin hyperpigmentation: Secondary | ICD-10-CM | POA: Diagnosis not present

## 2019-12-24 ENCOUNTER — Telehealth (INDEPENDENT_AMBULATORY_CARE_PROVIDER_SITE_OTHER): Payer: BC Managed Care – PPO | Admitting: Cardiology

## 2019-12-24 ENCOUNTER — Telehealth: Payer: Self-pay | Admitting: *Deleted

## 2019-12-24 ENCOUNTER — Encounter: Payer: Self-pay | Admitting: Cardiology

## 2019-12-24 VITALS — Ht 64.0 in | Wt 180.0 lb

## 2019-12-24 DIAGNOSIS — Z8249 Family history of ischemic heart disease and other diseases of the circulatory system: Secondary | ICD-10-CM | POA: Diagnosis not present

## 2019-12-24 DIAGNOSIS — I201 Angina pectoris with documented spasm: Secondary | ICD-10-CM

## 2019-12-24 NOTE — Telephone Encounter (Signed)
  Patient Consent for Virtual Visit         Ashlee [name] has provided verbal consent on 12/24/2019 for a virtual visit (video or telephone).   CONSENT FOR VIRTUAL VISIT FOR:  Ashlee [name]  By participating in this virtual visit I agree to the following:  I hereby voluntarily request, consent and authorize [facility] and its employed or contracted physicians, physician assistants, nurse practitioners or other licensed health care professionals (the Practitioner), to provide me with telemedicine health care services (the "Services") as deemed necessary by the treating Practitioner. I acknowledge and consent to receive the Services by the Practitioner via telemedicine. I understand that the telemedicine visit will involve communicating with the Practitioner through live audiovisual communication technology and the disclosure of certain medical information by electronic transmission. I acknowledge that I have been given the opportunity to request an in-person assessment or other available alternative prior to the telemedicine visit and am voluntarily participating in the telemedicine visit.  I understand that I have the right to withhold or withdraw my consent to the use of telemedicine in the course of my care at any time, without affecting my right to future care or treatment, and that the Practitioner or I may terminate the telemedicine visit at any time. I understand that I have the right to inspect all information obtained and/or recorded in the course of the telemedicine visit and may receive copies of available information for a reasonable fee.  I understand that some of the potential risks of receiving the Services via telemedicine include:  Marland Kitchen Delay or interruption in medical evaluation due to technological equipment failure or disruption; . Information transmitted may not be sufficient (e.g. poor resolution of images) to allow for appropriate medical decision making by the Practitioner; and/or   . In rare instances, security protocols could fail, causing a breach of personal health information.  Furthermore, I acknowledge that it is my responsibility to provide information about my medical history, conditions and care that is complete and accurate to the best of my ability. I acknowledge that Practitioner's advice, recommendations, and/or decision may be based on factors not within their control, such as incomplete or inaccurate data provided by me or distortions of diagnostic images or specimens that may result from electronic transmissions. I understand that the practice of medicine is not an exact science and that Practitioner makes no warranties or guarantees regarding treatment outcomes. I acknowledge that a copy of this consent can be made available to me via my patient portal [facility] MyChart), or I can request a printed copy by calling the office of [facility].    I understand that my insurance will be billed for this visit.   I have read or had this consent read to me. . I understand the contents of this consent, which adequately explains the benefits and risks of the Services being provided via telemedicine.  . I have been provided ample opportunity to ask questions regarding this consent and the Services and have had my questions answered to my satisfaction. . I give my informed consent for the services to be provided through the use of telemedicine in my medical care

## 2019-12-24 NOTE — Patient Instructions (Addendum)
Medication Instructions:    Okay to stay off of amlodipine for now.  However if you do have a spell for which you take nitroglycerin, I would go back on amlodipine for 3 to 4 weeks and then slowly taper off either half a dose daily for a week or every other day for a week.  This would potentially reduce the risk of another flare.   *If you need a refill on your cardiac medications before your next appointment, please call your pharmacy*   Lab Work: None    Testing/Procedures: None   Follow-Up: At [facility], you and your health needs are our priority.  As part of our continuing mission to provide you with exceptional heart care, we have created designated Provider Care Teams.  These Care Teams include your primary Cardiologist (physician) and Advanced Practice Providers (APPs -  Physician Assistants and Nurse Practitioners) who all work together to provide you with the care you need, when you need it.    Your next appointment:   1 year(s)  The format for your next appointment:   Either In Person or Virtual  Provider:   Bryan Lemma, MD   Other Instructions  n/a

## 2019-12-24 NOTE — Progress Notes (Signed)
Virtual Visit via Telephone Note   This visit type was conducted due to national recommendations for restrictions regarding the COVID-19 Pandemic (e.g. social distancing) in an effort to limit this patient's exposure and mitigate transmission in our community.  Due to her co-morbid illnesses, this patient is at least at moderate risk for complications without adequate follow up.  This format is felt to be most appropriate for this patient at this time.  The patient did not have access to video technology/had technical difficulties with video requiring transitioning to audio format only (telephone).  All issues noted in this document were discussed and addressed.  No physical exam could be performed with this format.  Please refer to the patient's chart for her  consent to telehealth for [facility].   Patient has given verbal permission to conduct this visit via virtual appointment and to bill insurance 12/26/2019 10:14 PM     Evaluation Performed:  Follow-up visit  Date:  12/26/2019   ID:  Ashlee [name], DOB 06/10/1981, MRN [id number]  Patient Location: Home Provider Location: Other:  Hospital Office  [facility]P:  [facility]  Cardiologist:  No primary care provider on file.  Has been seen by Glenetta Hew, MD as well as [place] as a second opinion   Chief Complaint:   Chief Complaint  Patient presents with  . 27 months    no complaints ,  not taking Amlodpine  or Imdur   . Chest Pain    Variant Angina    History of Present Illness:    Ashlee [name] is a 39 y.o. female with PMH notable for Prinzmetal angina who presents via audio/video conferencing for a telehealth visit today.  Ashlee [name] was last seen in February 2019 as follow-up for CORONARY CALCIUM SCORE/CORONARY CTA.  In October 2019 was seen by [facility] for chest pain -> recommended Isordil (half dose) and low-dose amlodipine.  Was then seen in follow-up in April  2020-it eventually weaned off Isordil.  Then stopped amlodipine.  Symptoms recurred.  Took sublingual nitroglycerin.  Hospitalizations:  . n/a  Recent - Interim CV studies:   The following studies were reviewed today: . n/a:  Inerval History   Ashlee [name] is following up almost for 2-year follow-up (having seen good cardiology in the interim).  She is doing great she says over the last 4 to 5 months she has not had any further episodes of chest pain.  Prior to that she has had to use nitroglycerin a couple times.  She is trying to see how well she does off of amlodipine having already quit taking Imdur.  She says she has been off of the amlodipine now for about 6 weeks with no further symptoms.  The last time she had an episode she did go back on amlodipine for a few months and things settle back down again. It seems like when her ankylosing spondylitis flares up, his episodes happen more frequently.  She is definitely trying to avoid triggers although she is not hunting clear what the triggers are besides flareups of her chronic condition.  I think a lot of it has to do with the fact that she is more calm now that she has an understanding of what is going on.  The most recent episodes have not been all that prolonged, and nowhere near as intense as they were when I first met her.  Cardiovascular ROS: no chest pain or dyspnea  on exertion negative for - edema, irregular heartbeat, orthopnea, palpitations, paroxysmal nocturnal dyspnea, rapid heart rate, shortness of breath or Syncope/near syncope with TIA/amaurosis fugax, claudication    ROS:  Please see the history of present illness.    The patient does not have symptoms concerning for COVID-19 infection (fever, chills, cough, or new shortness of breath).  Review of Systems  Constitutional: Negative for malaise/fatigue and weight loss.  HENT: Negative for nosebleeds.   Gastrointestinal: Negative for blood in stool and melena.    Genitourinary: Negative for hematuria.  Musculoskeletal: Positive for back pain.       Not really having any recent flares of ankylosing spondylitis.  Is now on Cosentyx having had issues on her previous medication.  Things are stabilizing now.  Neurological: Negative for dizziness, seizures and weakness.  Psychiatric/Behavioral: Negative for depression. The patient is not nervous/anxious.    The patient is practicing social distancing.  Past Medical History:  Diagnosis Date  . Ankylosing spondylitis ([place])   . Prinzmetal variant angina ([place]) 03/2017   Coronary calcium score was 0 with no evidence of coronary disease. However prolonged episodes of substernal chest pain relieved with nitroglycerin.    Past Surgical History:  Procedure Laterality Date  . ABLATION  2012  . CESAREAN SECTION  2007, 2012   x 2   . CORONARY CALCIUM SCORE /CTA  03/2017   . Coronary artery calcium score of 0 Agatston units, suggesting low risk for future cardiac events.  No coronary plaque or stenosis  . NOSE SURGERY    . TONSILLECTOMY  2009  . TUBAL LIGATION  2012     Current Meds  Medication Sig  . COSENTYX SENSOREADY, 300 MG, 150 MG/ML SOAJ every 30 (thirty) days.   Marland Kitchen ibuprofen (ADVIL,MOTRIN) 200 MG tablet Take 400-800 mg by mouth every 8 (eight) hours as needed for mild pain or moderate pain.   . Multiple Vitamin (MULTIVITAMIN) capsule Take 1 capsule by mouth daily.  . nitroGLYCERIN (NITROSTAT) 0.4 MG SL tablet PLACE ONE TABLET UNDER THE TONGUE EVERY FIVE MINUTES AS NEEDED FOR CHEST PAIN. MAXIMUM DOSES OF THREE TABLETS.     Allergies:   Wellbutrin [bupropion]   Social History   Tobacco Use  . Smoking status: Former Smoker    Packs/day: 1.00    Types: E-cigarettes    Quit date: 03/28/2017    Years since quitting: 2.7  . Smokeless tobacco: Never Used  . Tobacco comment: Quit in 07/2013  Substance Use Topics  . Alcohol use: Yes    Comment: occasionally  . Drug use: No     Family Hx: The  patient's family history includes Ankylosing spondylitis in her maternal grandmother; Arrhythmia in her paternal grandmother; Cancer in her sister; Cardiomyopathy (age of onset: 38) in her maternal aunt; Diabetes in her paternal grandfather; Heart failure in her maternal grandmother; Hypertension in her father; Kidney cancer in her paternal grandfather; Skin cancer in her maternal grandfather and sister.   Labs/Other Tests and Data Reviewed:    EKG:  No ECG reviewed.  Recent Labs: No results found for requested labs within last 8760 hours.   Recent Lipid Panel No results found for: CHOL, TRIG, HDL, CHOLHDL, LDLCALC, LDLDIRECT  Wt Readings from Last 3 Encounters:  12/24/19 180 lb (81.6 kg)  02/04/18 170 lb (77.1 kg)  10/30/17 173 lb 12.8 oz (78.8 kg)     Objective:    Vital Signs:  Ht 5' 4"  (1.626 m)   Wt 180 lb (81.6 kg)  BMI 30.90 kg/m   VITAL SIGNS:  reviewed Pleasant young woman, in  acute distress. A&O x 3.  Normal Mood & Affect Non-labored respirations In good spirits.  Does not seem anxious.  ASSESSMENT & PLAN:    Problem List Items Addressed This Visit    Prinzmetal variant angina ([place]) - Primary (Chronic)    Symptoms are pretty well controlled at this point.  She is trying to go as long as she can without being on amlodipine.  Has been about for 5 months now without an episode.  She says that she is not even taking nitroglycerin and that amount of time.  At this point, I think what I would recommend that she does is stay off Imdur, continue off of amlodipine for now, but if she does require nitroglycerin, she should then take amlodipine for about a month.  We discussed trying to avoid triggers such as stress, anxiety provoking activities, NSAIDs, caffeine and excess sugar.  Also being dehydrated.      Family history of premature CAD    Young woman with pretty much normal coronary calcium/CTA.  Would simply follow lipids and treat accordingly.  Lipids of a pretty well  controlled.         COVID-19 Education: The signs and symptoms of COVID-19 were discussed with the patient and how to seek care for testing (follow up with PCP or arrange E-visit).   The importance of social distancing was discussed today.  Time:   Today, I have spent 16 minutes with the patient with telehealth technology discussing the above problems.  5 min for chart review & charting. - total 21 min   Medication Adjustments/Labs and Tests Ordered: Current medicines are reviewed at length with the patient today.  Concerns regarding medicines are outlined above.   Patient Instructions  Medication Instructions:    Okay to stay off of amlodipine for now.  However if you do have a spell for which you take nitroglycerin, I would go back on amlodipine for 3 to 4 weeks and then slowly taper off either half a dose daily for a week or every other day for a week.  This would potentially reduce the risk of another flare.   *If you need a refill on your cardiac medications before your next appointment, please call your pharmacy*   Lab Work: None    Testing/Procedures: None   Follow-Up: At [facility], you and your health needs are our priority.  As part of our continuing mission to provide you with exceptional heart care, we have created designated Provider Care Teams.  These Care Teams include your primary Cardiologist (physician) and Advanced Practice Providers (APPs -  Physician Assistants and Nurse Practitioners) who all work together to provide you with the care you need, when you need it.    Your next appointment:   1 year(s)  The format for your next appointment:   Either In Person or Virtual  Provider:   Glenetta Hew, MD   Other Instructions  n/a     Signed, Glenetta Hew, MD  12/26/2019 10:14 PM    [place]

## 2019-12-24 NOTE — Telephone Encounter (Signed)
RN called left detailed message  to patient. Instruction were given  from today's virtual visit 12/24/19 .  AVS SUMMARY has been sent by mychart .  Recall placed.   any question may call back.

## 2019-12-26 ENCOUNTER — Encounter: Payer: Self-pay | Admitting: Cardiology

## 2019-12-26 NOTE — Assessment & Plan Note (Addendum)
Symptoms are pretty well controlled at this point.  She is trying to go as long as she can without being on amlodipine.  Has been about for 5 months now without an episode.  She says that she is not even taking nitroglycerin and that amount of time.  At this point, I think what I would recommend that she does is stay off Imdur, continue off of amlodipine for now, but if she does require nitroglycerin, she should then take amlodipine for about a month.  We discussed trying to avoid triggers such as stress, anxiety provoking activities, NSAIDs, caffeine and excess sugar.  Also being dehydrated.

## 2019-12-26 NOTE — Assessment & Plan Note (Signed)
Young woman with pretty much normal coronary calcium/CTA.  Would simply follow lipids and treat accordingly.  Lipids of a pretty well controlled.

## 2020-01-07 DIAGNOSIS — J309 Allergic rhinitis, unspecified: Secondary | ICD-10-CM | POA: Diagnosis not present

## 2020-01-07 DIAGNOSIS — L298 Other pruritus: Secondary | ICD-10-CM | POA: Diagnosis not present

## 2020-01-07 DIAGNOSIS — R067 Sneezing: Secondary | ICD-10-CM | POA: Diagnosis not present

## 2020-01-07 DIAGNOSIS — R0981 Nasal congestion: Secondary | ICD-10-CM | POA: Diagnosis not present

## 2020-01-14 DIAGNOSIS — R067 Sneezing: Secondary | ICD-10-CM | POA: Diagnosis not present

## 2020-01-14 DIAGNOSIS — L298 Other pruritus: Secondary | ICD-10-CM | POA: Diagnosis not present

## 2020-01-14 DIAGNOSIS — R0981 Nasal congestion: Secondary | ICD-10-CM | POA: Diagnosis not present

## 2020-01-14 DIAGNOSIS — J309 Allergic rhinitis, unspecified: Secondary | ICD-10-CM | POA: Diagnosis not present

## 2020-02-09 ENCOUNTER — Other Ambulatory Visit: Payer: Self-pay | Admitting: Cardiology

## 2020-03-03 DIAGNOSIS — M450 Ankylosing spondylitis of multiple sites in spine: Secondary | ICD-10-CM | POA: Diagnosis not present

## 2020-03-03 DIAGNOSIS — R5382 Chronic fatigue, unspecified: Secondary | ICD-10-CM | POA: Diagnosis not present

## 2020-03-03 DIAGNOSIS — Z79899 Other long term (current) drug therapy: Secondary | ICD-10-CM | POA: Diagnosis not present

## 2020-03-03 DIAGNOSIS — M545 Low back pain: Secondary | ICD-10-CM | POA: Diagnosis not present

## 2020-04-04 DIAGNOSIS — L239 Allergic contact dermatitis, unspecified cause: Secondary | ICD-10-CM | POA: Diagnosis not present

## 2020-04-04 DIAGNOSIS — B354 Tinea corporis: Secondary | ICD-10-CM | POA: Diagnosis not present

## 2020-04-17 ENCOUNTER — Ambulatory Visit: Payer: BC Managed Care – PPO | Admitting: Cardiology

## 2020-04-17 ENCOUNTER — Other Ambulatory Visit: Payer: Self-pay

## 2020-04-17 ENCOUNTER — Encounter: Payer: Self-pay | Admitting: Cardiology

## 2020-04-17 VITALS — BP 126/82 | HR 75 | Ht 64.0 in | Wt 175.0 lb

## 2020-04-17 DIAGNOSIS — Z8249 Family history of ischemic heart disease and other diseases of the circulatory system: Secondary | ICD-10-CM

## 2020-04-17 DIAGNOSIS — I201 Angina pectoris with documented spasm: Secondary | ICD-10-CM

## 2020-04-17 MED ORDER — ISOSORBIDE MONONITRATE ER 30 MG PO TB24: 30.0000 mg | ORAL_TABLET | Freq: Every day | ORAL | 3 refills | Status: DC

## 2020-04-17 MED ORDER — NITROGLYCERIN 0.4 MG SL SUBL: SUBLINGUAL_TABLET | SUBLINGUAL | 3 refills | Status: DC

## 2020-04-17 NOTE — Patient Instructions (Addendum)
Medication Instructions:     AMLODIPINE 2.5 MG  TAKE IN THE MORNING,  RESTART ISOSORBIDE MONONITRATE    30 MG  AT BEDTIME FOR 1 MONTH  THEN  TRY TO WEAN DOWN OFF MEDICATIONS    FOR 2 WEEKS  ALTERNATING TAKING MEDICATIONS EVERY OTHER DAY  THEN CAN TAKE IT ON AN AS NEEDED BASIS   *If you need a refill on your cardiac medications before your next appointment, please call your pharmacy*   Lab Work: NOT NEEDED   Testing/Procedures: NOT NEEDED   Follow-Up: At [facility], you and your health needs are our priority.  As part of our continuing mission to provide you with exceptional heart care, we have created designated Provider Care Teams.  These Care Teams include your primary Cardiologist (physician) and Advanced Practice Providers (APPs -  Physician Assistants and Nurse Practitioners) who all work together to provide you with the care you need, when you need it.    Your next appointment:   2 month(s)  The format for your next appointment:   Virtual Visit   Provider:   Bryan Lemma, MD   Other Instructions

## 2020-04-17 NOTE — Progress Notes (Signed)
Primary Care Provider: [facility] Cardiologist: No primary care provider on [name]. Electrophysiologist: None  Clinic Note: Chief Complaint  Patient presents with  . Follow-up    Having more chest pain  . Chest Pain    Feels a little different than original symptoms, same location   HPI:    Ashlee [name] is a 39 y.o. female with a PMH notable for Prinzmetal angina who presents today for recurrence of left-sided chest pain.Ashlee [name] was last seen on December 24, 2019 via telemedicine for routine follow-up.  Apparently she had gone to seek a second opinion at [facility] back in October 2019 and they actually again recommended trial of work Isordil and low-dose amlodipine.  They agreed with symptoms.  Unfortunately she did not do well with Isordil so it was weaned off.  She has been on amlodipine which has been subsequently weaned off.  Recent Hospitalizations: None  Reviewed  CV studies:    The following studies were reviewed today: (if available, images/films reviewed: From Epic Chart or Care Everywhere) . None:   Interval History:   Ashlee [name] returns today stating that she had been doing really well for about 3 months no longer on amlodipine.  She is feeling great has not had any issues be some mild twinges of chest discomfort, but now over the last month she has been having a resurgence of symptoms.  Now the last week or so it has been different though as far as the sensation is less of a pressure and more of an aching discomfort that she feels now it is in the same location.  It is not associated with movement.  It is not positional.  She has prolonged episodes lasting more than 15 to 20 minutes at a time but is more of an aching sensation.  It has gotten worse and it came to ahead this past weekend.  She was not sure whether she should restart her amlodipine because it was a different sensation. She describes this aching sensation is a  more uncomfortable symptom and not associated with as much dyspnea.  She is not having palpitations or irregular heartbeats.  It also is not making her feel as lightheaded and near syncopal as she is at.  Despite this, she is concerned because it is now recurrence after having a good time without any.  She does have lots of issues going on at home and thinks that maybe some of that may be triggered her to have more symptoms.2  CV Review of Symptoms (Summary) Cardiovascular ROS: positive for - chest pain and Associate with a little nausea but not necessarily dyspnea or irregular heartbeats.  Chest discomfort is not associate with activity. negative for - edema, irregular heartbeat, orthopnea, palpitations, paroxysmal nocturnal dyspnea, rapid heart rate, shortness of breath or Syncope/near syncope or TIA/amaurosis fugax, claudication  The patient does not have symptoms concerning for COVID-19 infection (fever, chills, cough, or new shortness of breath).  The patient is practicing social distancing & Masking.    REVIEWED OF SYSTEMS   ROS   I have reviewed and (if needed) personally updated the patient's problem list, medications, allergies, past medical and surgical history, social and family history.   PAST MEDICAL HISTORY   Past Medical History:  Diagnosis Date  . Ankylosing spondylitis (HCC)   . Prinzmetal variant angina (HCC) 03/2017   Coronary calcium score was 0 with no evidence of coronary disease. However prolonged episodes  of substernal chest pain relieved with nitroglycerin.    PAST SURGICAL HISTORY   Past Surgical History:  Procedure Laterality Date  . ABLATION  2012  . CESAREAN SECTION  2007, 2012   x 2   . CORONARY CALCIUM SCORE /CTA  03/2017   . Coronary artery calcium score of 0 Agatston units, suggesting low risk for future cardiac events.  No coronary plaque or stenosis  . NOSE SURGERY    . TONSILLECTOMY  2009  . TUBAL LIGATION  2012    MEDICATIONS/ALLERGIES    Current Meds  Medication Sig  . amLODipine (NORVASC) 2.5 MG tablet   . COSENTYX SENSOREADY, 300 MG, 150 MG/ML SOAJ every 30 (thirty) days.   Marland Kitchen ibuprofen (ADVIL,MOTRIN) 200 MG tablet Take 400-800 mg by mouth every 8 (eight) hours as needed for mild pain or moderate pain.   . Multiple Vitamin (MULTIVITAMIN) capsule Take 1 capsule by mouth daily.  . nitroGLYCERIN (NITROSTAT) 0.4 MG SL tablet PLACE ONE TABLET UNDER THE TONGUE EVERY FIVE MINUTES AS NEEDED FOR CHEST PAIN. MAXIMUM DOSE OF THREE TABLETS  . [DISCONTINUED] nitroGLYCERIN (NITROSTAT) 0.4 MG SL tablet PLACE ONE TABLET UNDER THE TONGUE EVERY FIVE MINUTES AS NEEDED FOR CHEST PAIN. MAXIMUM DOSE OF THREE TABLETS    Allergies  Allergen Reactions  . Wellbutrin [Bupropion] Hives    SOCIAL HISTORY/FAMILY HISTORY   Reviewed in Epic:  Pertinent findings: No new changes.  OBJCTIVE -PE, EKG, labs   Wt Readings from Last 3 Encounters:  04/17/20 175 lb (79.4 kg)  12/24/19 180 lb (81.6 kg)  02/04/18 170 lb (77.1 kg)    Physical Exam: BP 126/82   Pulse 75   Ht 5\' 4"  (1.626 m)   Wt 175 lb (79.4 kg)   SpO2 100%   BMI 30.04 kg/m  Physical Exam Constitutional:      General: She is not in acute distress.    Appearance: Normal appearance. She is not ill-appearing.     Comments: By BMI and she is "obese, but does not have the appearance of an obese patient.  HENT:     Head: Normocephalic.  Cardiovascular:     Rate and Rhythm: Normal rate and regular rhythm.     Pulses: Normal pulses.     Heart sounds: Normal heart sounds. No murmur heard.  No friction rub. No gallop.   Pulmonary:     Effort: No respiratory distress.     Breath sounds: Normal breath sounds.  Chest:     Chest wall: No tenderness (I did palpate all over now the sternal border on both sides along the floating ribs.).  Abdominal:     General: Bowel sounds are normal.     Tenderness: There is no abdominal tenderness.  Musculoskeletal:        General: No swelling  or tenderness. Normal range of motion.     Cervical back: Rigidity present.  Lymphadenopathy:     Cervical: Cervical adenopathy present.  Neurological:     Mental Status: She is alert.  Psychiatric:        Behavior: Behavior normal.        Thought Content: Thought content normal.        Judgment: Judgment normal.     Comments: Just a little anxious      Adult ECG Report NSR-75 bpm.  Otherwise normal axis, intervals durations.  Normal EKG.  Recent Labs: Blood No results found for: CHOL, HDL, LDLCALC, LDLDIRECT, TRIG, CHOLHDL Lab Results  Component Value Date  CREATININE 0.76 04/11/2017   BUN 12 04/11/2017   NA 140 04/11/2017   K 3.8 04/11/2017   CL 110 04/11/2017   CO2 22 04/11/2017   No results found for: TSH  ASSESSMENT/PLAN    Problem List Items Addressed This Visit    Prinzmetal variant angina (HCC) - Primary (Chronic)    At this point it is unlikely that she has had another reason for her to have pain in the chest.  The discomfort is left-sided and there is no tenderness to palpation.  It is not exertional in nature.  Was interesting is that the nature of the discomfort is different everything else is the same.  Just a descriptive nature is different.  I think the best course of action for now will be to simply have her restart the amlodipine pain and also add Imdur to 15 to 30 mg daily. We also talked about her potentially using as needed nitroglycerin.  She is avoiding triggers although is difficult to avoid stress. If she has more issues with palpitations or other untoward symptoms, we can then potentially consider a different type monitor.  Her husband is not here with her today, and he has had significant concerns as well.  At this time I do not think it makes any sense to do any additional testing.  We have tested her pretty extensively and did not find anything that explains her symptoms.  But we found his answers to what it is not.  It is unlikely that she has  developed obstructive coronary artery disease.  Her symptoms occur at rest and not with exertion.  Symptoms do not sound like costochondritis, although we may end up going down the road of analgesics if symptoms worsen.    Plan: Restart amlodipine at 2.5 mg daily.  Also restart isosorbide mononitrate (initially indicated 30 mg, but I told her offline to try 15 mg nightly at bedtime) the plan will be 1 month of both medications and then for 2 weeks alternate every other day taking one of the 2 medications.  Then go back to doing as-needed basis.      Relevant Medications   nitroGLYCERIN (NITROSTAT) 0.4 MG SL tablet   Other Relevant Orders   EKG 12-Lead (Completed)   Family history of premature CAD   Relevant Orders   EKG 12-Lead (Completed)     1 COVID-19 Education: The signs and symptoms of COVID-19 were discussed with the patient and how to seek care for testing (follow up with PCP or arrange E-visit).   The importance of social distancing and COVID-19 vaccination was discussed today.  I spent a total of 25 minutes with the patient. >  50% of the time was spent in direct patient consultation.  Additional time spent with chart review  / charting (studies, outside notes, etc): 6 Total Time: 31 min   Current medicines are reviewed at length with the patient today.  (+/- concerns) N/A  Notice: This dictation was prepared with Dragon dictation along with smaller phrase technology. Any transcriptional errors that result from this process are unintentional and may not be corrected upon review.  Patient Instructions / Medication Changes & Studies & Tests Ordered   Patient Instructions  Medication Instructions:     AMLODIPINE 2.5 MG  TAKE IN THE MORNING,  RESTART ISOSORBIDE MONONITRATE    30 MG  AT BEDTIME FOR 1 MONTH  THEN  TRY TO WEAN DOWN OFF MEDICATIONS    FOR 2 WEEKS  ALTERNATING TAKING MEDICATIONS  EVERY OTHER DAY  THEN CAN TAKE IT ON AN AS NEEDED BASIS   *If you need a refill on  your cardiac medications before your next appointment, please call your pharmacy*   Lab Work: NOT NEEDED   Testing/Procedures: NOT NEEDED   Follow-Up: At [facility], you and your health needs are our priority.  As part of our continuing mission to provide you with exceptional heart care, we have created designated Provider Care Teams.  These Care Teams include your primary Cardiologist (physician) and Advanced Practice Providers (APPs -  Physician Assistants and Nurse Practitioners) who all work together to provide you with the care you need, when you need it.    Your next appointment:   2 month(s)  The format for your next appointment:   Virtual Visit   Provider:   Bryan Lemmaavid Ermine Stebbins, MD   Other Instructions     Studies Ordered:   Orders Placed This Encounter  Procedures  . EKG 12-Lead     Bryan Lemmaavid Taron Conrey, M.D., M.S. Interventional Cardiologist   Pager # [phone] Phone # [phone] [address] [place], [place] [address]   Thank you for choosing Heartcare at [facility]!!

## 2020-04-18 ENCOUNTER — Telehealth: Payer: Self-pay | Admitting: Cardiology

## 2020-04-18 NOTE — Telephone Encounter (Signed)
Patient's husband, Tammy Sours is calling with concerns about the patient's pain that she has been having. He states he is not trying to undermine Dr. Herbie Baltimore in any way, but he is wanting to know if there is any kind of test to see where the root cause of this pain is coming from, or if there a scan that can be done like a CT to see what is going on.

## 2020-04-18 NOTE — Telephone Encounter (Signed)
Spoke to patient's husband he wanted Dr.Harding to call anytime after 3:00 pm at # [phone] to discuss wife's chest pain.Stated he wanted to make sure nothing else can be done besides taking medications.Stated she cannot take Isosorbide 30 mg causes bad headaches.Advised try taking 1/2 tablet 15 mg daily.Stated he still would like to speak to Dr.Harding.He wanted to make sure nothing has been missed.Advised Dr.Harding working at hospital today.I will send message to him.

## 2020-04-19 NOTE — Addendum Note (Signed)
Addended by: Neoma Laming on: 04/19/2020 04:24 PM   Modules accepted: Orders

## 2020-04-19 NOTE — Telephone Encounter (Signed)
She is having is a little different than she was having.  It is not clear what she is feeling, but it was stated reason that the symptoms are just a little bit different than what she had been having before and we documented which she was having.  There does not need to be any more testing done because that will not change what we do.  We need to give time for medications to kick in.  She just started taking amlodipine back again.  Taking Imdur half a tablet with baby aspirin should help.  Doing expensive test is not giving [place] the question in this situation.   As I discussed with the patient, a lot of these symptoms can be made worse with stress.  The patient's husband needs to help avoid making the stress worse.    The other way to treat pain is with analgesics --> but I would prefer to try more physiologic medications.   I am in the Cath Lab all day until Friday - so likely will not have time to call & talk to her husband.   Bryan Lemma, MD

## 2020-04-19 NOTE — Telephone Encounter (Signed)
Returned call to patient's husband left Dr.Harding's advice on personal voice mail.Advised to call back if he has any questions.

## 2020-04-19 NOTE — Telephone Encounter (Signed)
Patients husband returned your call

## 2020-04-19 NOTE — Telephone Encounter (Signed)
Returned call to patient's husband Dr.Harding's advice and instructions given.Advised if she continues to have a headache taking 15 mg of Imdur to call back.

## 2020-04-21 ENCOUNTER — Encounter: Payer: Self-pay | Admitting: Cardiology

## 2020-04-21 NOTE — Assessment & Plan Note (Addendum)
At this point it is unlikely that she has had another reason for her to have pain in the chest.  The discomfort is left-sided and there is no tenderness to palpation.  It is not exertional in nature.  Was interesting is that the nature of the discomfort is different everything else is the same.  Just a descriptive nature is different.  I think the best course of action for now will be to simply have her restart the amlodipine pain and also add Imdur to 15 to 30 mg daily. We also talked about her potentially using as needed nitroglycerin.  She is avoiding triggers although is difficult to avoid stress. If she has more issues with palpitations or other untoward symptoms, we can then potentially consider a different type monitor.  Her husband is not here with her today, and he has had significant concerns as well.  At this time I do not think it makes any sense to do any additional testing.  We have tested her pretty extensively and did not find anything that explains her symptoms.  But we found his answers to what it is not.  It is unlikely that she has developed obstructive coronary artery disease.  Her symptoms occur at rest and not with exertion.  Symptoms do not sound like costochondritis, although we may end up going down the road of analgesics if symptoms worsen.    Plan: Restart amlodipine at 2.5 mg daily.  Also restart isosorbide mononitrate (initially indicated 30 mg, but I told her offline to try 15 mg nightly at bedtime) the plan will be 1 month of both medications and then for 2 weeks alternate every other day taking one of the 2 medications.  Then go back to doing as-needed basis.

## 2020-06-21 DIAGNOSIS — D225 Melanocytic nevi of trunk: Secondary | ICD-10-CM | POA: Diagnosis not present

## 2020-06-21 DIAGNOSIS — L570 Actinic keratosis: Secondary | ICD-10-CM | POA: Diagnosis not present

## 2020-06-21 DIAGNOSIS — D485 Neoplasm of uncertain behavior of skin: Secondary | ICD-10-CM | POA: Diagnosis not present

## 2020-06-21 DIAGNOSIS — D1801 Hemangioma of skin and subcutaneous tissue: Secondary | ICD-10-CM | POA: Diagnosis not present

## 2020-06-30 ENCOUNTER — Encounter: Payer: Self-pay | Admitting: Cardiology

## 2020-06-30 ENCOUNTER — Telehealth: Payer: Self-pay | Admitting: *Deleted

## 2020-06-30 ENCOUNTER — Telehealth (INDEPENDENT_AMBULATORY_CARE_PROVIDER_SITE_OTHER): Payer: BC Managed Care – PPO | Admitting: Cardiology

## 2020-06-30 VITALS — Ht 64.0 in | Wt 165.0 lb

## 2020-06-30 DIAGNOSIS — I201 Angina pectoris with documented spasm: Secondary | ICD-10-CM

## 2020-06-30 MED ORDER — ISOSORBIDE MONONITRATE ER 30 MG PO TB24: ORAL_TABLET | ORAL | 3 refills | Status: DC

## 2020-06-30 MED ORDER — AMLODIPINE BESYLATE 2.5 MG PO TABS: 2.5000 mg | ORAL_TABLET | ORAL | 2 refills | Status: DC | PRN

## 2020-06-30 NOTE — Telephone Encounter (Signed)
FIRST ATTEMPT TO CONTACT PATIENT. Left message on voice mail

## 2020-06-30 NOTE — Patient Instructions (Addendum)
Medication Instructions:   Continue with current plan: For short lived symptoms okay to use as needed nitroglycerin (we will make sure you have refills.)  For bad spells I want you to start taking both the isosorbide mononitrate (Imdur) as well as then amlodipine for at least 2 to 3 weeks before you start coming off of the Imdur like you did this last time. If symptoms are unabated continue amlodipine for about 2 more weeks and then wean off.  We will make sure you have a prescription for the amlodipine-90-day supply with 3 refills as well as an as needed nitroglycerin refill. Let me know when you need refills for the Imdur.  *If you need a refill on your cardiac medications before your next appointment, please call your pharmacy*   Lab Work: None    Testing/Procedures: None   Follow-Up: At [facility], you and your health needs are our priority.  As part of our continuing mission to provide you with exceptional heart care, we have created designated Provider Care Teams.  These Care Teams include your primary Cardiologist (physician) and Advanced Practice Providers (APPs -  Physician Assistants and Nurse Practitioners) who all work together to provide you with the care you need, when you need it.     Your next appointment:   1 year(s)  The format for your next appointment:   In Person  Provider:   Bryan Lemma, MD   Other Instructions N/A

## 2020-06-30 NOTE — Telephone Encounter (Signed)
Second attempt to contact patient . Will call back left message

## 2020-06-30 NOTE — Progress Notes (Signed)
Virtual Visit via Telephone Note   This visit type was conducted due to national recommendations for restrictions regarding the COVID-19 Pandemic (e.g. social distancing) in an effort to limit this patient's exposure and mitigate transmission in our community.  Due to her co-morbid illnesses, this patient is at least at moderate risk for complications without adequate follow up.  This format is felt to be most appropriate for this patient at this time.  The patient did not have access to video technology/had technical difficulties with video requiring transitioning to audio format only (telephone).  All issues noted in this document were discussed and addressed.  No physical exam could be performed with this format.  Please refer to the patient's chart for her  consent to telehealth for [facility].   Patient has given verbal permission to conduct this visit via virtual appointment and to bill insurance 06/30/2020 1:24 PM     Evaluation Performed:  Follow-up visit  Date:  06/30/2020   ID:  Ashlee [name], DOB [birth year], MRN [id number]  Patient Location: Home Provider Location: Home Office  PCP:  [facility]  Cardiologist:  No primary care provider on file.  Electrophysiologist:  None   Chief Complaint:   Chief Complaint  Patient presents with  . Follow-up    Doing well. Has now weaned back off medications.  . Chest Pain    Prinzmetal angina   History of Present Illness:    Ashlee [name] is a 39 y.o. female with PMH notable for PRINZMETAL ANGINA who presents via audio/video conferencing for a telehealth visit today.  Ashlee [name] was last seen 04/17/2020 - was noting significant episodes of angina. -- We restarted both amlodipine & Imdur.  Hospitalizations:  . None  Recent - Interim CV studies:   The following studies were reviewed today: . None:  Inerval History   Ashlee [name] is doing remarkably well now. She notes that took Imdur and amlodipine  through Aug & /Sept & then weaned off Imdur followed by Amlodipine. She said the Imdur really gives her bad headache and she prefers not to take it but it does work pretty quickly. She said that since she is weaned off the amlodipine she has had maybe 1 or 2 short spells but easily relieved with as needed nitroglycerin.  Otherwise she is back to her normal baseline doing well, and quite happy. She is very glad to see that the breakthrough symptom treatment seem to work.  Cardiovascular ROS: positive for - Now only very brief spells of chest pain-intermittently. Usually well controlled with as needed nitroglycerin. negative for - dyspnea on exertion, edema, irregular heartbeat, orthopnea, palpitations, paroxysmal nocturnal dyspnea, rapid heart rate, shortness of breath or Syncope or near syncope, TIA/amaurosis fugax  ROS:  Please see the history of present illness.    The patient does not have symptoms concerning for COVID-19 infection (fever, chills, cough, or new shortness of breath).  ROS otherwise negative  The patient is practicing social distancing.  Past Medical History:  Diagnosis Date  . Ankylosing spondylitis (HCC)   . Prinzmetal variant angina (HCC) 03/2017   Coronary calcium score was 0 with no evidence of coronary disease. However prolonged episodes of substernal chest pain relieved with nitroglycerin.   Past Surgical History:  Procedure Laterality Date  . ABLATION  2012  . CESAREAN SECTION  2007, 2012   x 2   . CORONARY CALCIUM SCORE /CTA  03/2017   . Coronary artery calcium score  of 0 Agatston units, suggesting low risk for future cardiac events.  No coronary plaque or stenosis  . NOSE SURGERY    . TONSILLECTOMY  2009  . TUBAL LIGATION  2012     Current Meds  Medication Sig  . COSENTYX SENSOREADY, 300 MG, 150 MG/ML SOAJ every 30 (thirty) days.   . Multiple Vitamin (MULTIVITAMIN) capsule Take 1 capsule by mouth daily.  . nitroGLYCERIN (NITROSTAT) 0.4 MG SL tablet PLACE  ONE TABLET UNDER THE TONGUE EVERY FIVE MINUTES AS NEEDED FOR CHEST PAIN. MAXIMUM DOSE OF THREE TABLETS     Allergies:   Wellbutrin [bupropion]   Social History   Tobacco Use  . Smoking status: Former Smoker    Packs/day: 1.00    Types: E-cigarettes    Quit date: 03/28/2017    Years since quitting: 3.2  . Smokeless tobacco: Never Used  . Tobacco comment: Quit in 07/2013  Vaping Use  . Vaping Use: Every day  Substance Use Topics  . Alcohol use: Yes    Comment: occasionally  . Drug use: No     Family Hx: The patient's family history includes Ankylosing spondylitis in her maternal grandmother; Arrhythmia in her paternal grandmother; Cancer in her sister; Cardiomyopathy (age of onset: 32) in her maternal aunt; Diabetes in her paternal grandfather; Heart failure in her maternal grandmother; Hypertension in her father; Kidney cancer in her paternal grandfather; Skin cancer in her maternal grandfather and sister.   Labs/Other Tests and Data Reviewed:    EKG:  No ECG reviewed.  Recent Labs: No results found for requested labs within last 8760 hours.   Recent Lipid Panel No results found for: CHOL, TRIG, HDL, CHOLHDL, LDLCALC, LDLDIRECT  Wt Readings from Last 3 Encounters:  06/30/20 165 lb (74.8 kg)  04/17/20 175 lb (79.4 kg)  12/24/19 180 lb (81.6 kg)     Objective:    Vital Signs:  Ht 5\' 4"  (1.626 m)   Wt 165 lb (74.8 kg)   BMI 28.32 kg/m   No vital signs available Very pleasant, happy young woman in no acute distress. A&O x 3. Pleasant, bubbly/happy Mood & Affect Non-labored respirations   ASSESSMENT & PLAN:    Problem List Items Addressed This Visit    Prinzmetal variant angina (HCC) - Primary (Chronic)     Continue with current plan: For short lived symptoms okay to use as needed nitroglycerin (we will make sure you have refills.)  For bad spells I want you to start taking both the isosorbide mononitrate (Imdur) as well as then amlodipine for at least 2 to 3  weeks before you start coming off of the Imdur like you did this last time. If symptoms are unabated continue amlodipine for about 2 more weeks and then wean off.  We will make sure you have a prescription for the amlodipine-90-day supply with 3 refills as well as an as needed nitroglycerin refill. Let me know when you need refills for the Imdur.       Relevant Medications   amLODipine (NORVASC) 2.5 MG tablet   isosorbide mononitrate (IMDUR) 30 MG 24 hr tablet      COVID-19 Education: The signs and symptoms of COVID-19 were discussed with the patient and how to seek care for testing (follow up with PCP or arrange E-visit).   The importance of social distancing was discussed today.  Time:   Today, I have spent 7 minutes with the patient with telehealth technology discussing the above problems.  Medication Adjustments/Labs and Tests Ordered: Current medicines are reviewed at length with the patient today.  Concerns regarding medicines are outlined above.   Patient Instructions  Medication Instructions:   Continue with current plan: For short lived symptoms okay to use as needed nitroglycerin (we will make sure you have refills.)  For bad spells I want you to start taking both the isosorbide mononitrate (Imdur) as well as then amlodipine for at least 2 to 3 weeks before you start coming off of the Imdur like you did this last time. If symptoms are unabated continue amlodipine for about 2 more weeks and then wean off.  We will make sure you have a prescription for the amlodipine-90-day supply with 3 refills as well as an as needed nitroglycerin refill. Let me know when you need refills for the Imdur.  *If you need a refill on your cardiac medications before your next appointment, please call your pharmacy*   Lab Work: None    Testing/Procedures: None   Follow-Up: At [facility], you and your health needs are our priority.  As part of our continuing mission to provide  you with exceptional heart care, we have created designated Provider Care Teams.  These Care Teams include your primary Cardiologist (physician) and Advanced Practice Providers (APPs -  Physician Assistants and Nurse Practitioners) who all work together to provide you with the care you need, when you need it.     Your next appointment:   1 year(s)  The format for your next appointment:   In Person  Provider:   Bryan Lemma, MD   Other Instructions N/A     Signed, Bryan Lemma, MD  06/30/2020 1:24 PM     [facility]

## 2020-06-30 NOTE — Telephone Encounter (Signed)
RN left detailed message on patient voicemail. Instruction were given  About today's virtual visit 06/30/20 .  AVS SUMMARY has been sent by mychart . prescription e-sent to pharmacy - to be placed on file  Recall paced for annual visit 06/2021  any question may call back

## 2020-06-30 NOTE — Assessment & Plan Note (Signed)
   Continue with current plan: For short lived symptoms okay to use as needed nitroglycerin (we will make sure you have refills.)  For bad spells I want you to start taking both the isosorbide mononitrate (Imdur) as well as then amlodipine for at least 2 to 3 weeks before you start coming off of the Imdur like you did this last time. If symptoms are unabated continue amlodipine for about 2 more weeks and then wean off.  We will make sure you have a prescription for the amlodipine-90-day supply with 3 refills as well as an as needed nitroglycerin refill. Let me know when you need refills for the Imdur.

## 2020-07-03 DIAGNOSIS — Z13220 Encounter for screening for lipoid disorders: Secondary | ICD-10-CM | POA: Diagnosis not present

## 2020-07-03 DIAGNOSIS — M450 Ankylosing spondylitis of multiple sites in spine: Secondary | ICD-10-CM | POA: Diagnosis not present

## 2020-07-03 DIAGNOSIS — Z8342 Family history of familial hypercholesterolemia: Secondary | ICD-10-CM | POA: Diagnosis not present

## 2020-07-03 DIAGNOSIS — R5382 Chronic fatigue, unspecified: Secondary | ICD-10-CM | POA: Diagnosis not present

## 2020-07-03 DIAGNOSIS — Z79899 Other long term (current) drug therapy: Secondary | ICD-10-CM | POA: Diagnosis not present

## 2020-07-06 DIAGNOSIS — D485 Neoplasm of uncertain behavior of skin: Secondary | ICD-10-CM | POA: Diagnosis not present

## 2020-07-06 DIAGNOSIS — L988 Other specified disorders of the skin and subcutaneous tissue: Secondary | ICD-10-CM | POA: Diagnosis not present

## 2020-07-07 DIAGNOSIS — R5383 Other fatigue: Secondary | ICD-10-CM | POA: Diagnosis not present

## 2020-07-07 DIAGNOSIS — R102 Pelvic and perineal pain: Secondary | ICD-10-CM | POA: Diagnosis not present

## 2020-07-07 DIAGNOSIS — Z6829 Body mass index (BMI) 29.0-29.9, adult: Secondary | ICD-10-CM | POA: Diagnosis not present

## 2020-07-07 DIAGNOSIS — Z01419 Encounter for gynecological examination (general) (routine) without abnormal findings: Secondary | ICD-10-CM | POA: Diagnosis not present

## 2020-07-07 DIAGNOSIS — Z780 Asymptomatic menopausal state: Secondary | ICD-10-CM | POA: Diagnosis not present

## 2020-07-17 DIAGNOSIS — N83202 Unspecified ovarian cyst, left side: Secondary | ICD-10-CM | POA: Diagnosis not present

## 2020-07-17 DIAGNOSIS — R102 Pelvic and perineal pain: Secondary | ICD-10-CM | POA: Diagnosis not present

## 2020-07-17 DIAGNOSIS — R9389 Abnormal findings on diagnostic imaging of other specified body structures: Secondary | ICD-10-CM | POA: Diagnosis not present

## 2020-07-17 DIAGNOSIS — N858 Other specified noninflammatory disorders of uterus: Secondary | ICD-10-CM | POA: Diagnosis not present

## 2020-09-11 DIAGNOSIS — R102 Pelvic and perineal pain: Secondary | ICD-10-CM | POA: Diagnosis not present

## 2020-09-11 DIAGNOSIS — N858 Other specified noninflammatory disorders of uterus: Secondary | ICD-10-CM | POA: Diagnosis not present

## 2020-09-11 DIAGNOSIS — N83202 Unspecified ovarian cyst, left side: Secondary | ICD-10-CM | POA: Diagnosis not present

## 2020-09-19 DIAGNOSIS — Z681 Body mass index (BMI) 19 or less, adult: Secondary | ICD-10-CM | POA: Diagnosis not present

## 2020-09-19 DIAGNOSIS — J019 Acute sinusitis, unspecified: Secondary | ICD-10-CM | POA: Diagnosis not present

## 2020-10-27 DIAGNOSIS — Z1231 Encounter for screening mammogram for malignant neoplasm of breast: Secondary | ICD-10-CM | POA: Diagnosis not present

## 2020-12-11 DIAGNOSIS — G4701 Insomnia due to medical condition: Secondary | ICD-10-CM | POA: Diagnosis not present

## 2020-12-11 DIAGNOSIS — J301 Allergic rhinitis due to pollen: Secondary | ICD-10-CM | POA: Diagnosis not present

## 2021-08-20 ENCOUNTER — Telehealth: Payer: Self-pay | Admitting: Cardiology

## 2021-08-20 ENCOUNTER — Other Ambulatory Visit: Payer: Self-pay | Admitting: Cardiology

## 2021-08-20 MED ORDER — AMLODIPINE BESYLATE 2.5 MG PO TABS: 2.5000 mg | ORAL_TABLET | ORAL | 0 refills | Status: DC | PRN

## 2021-08-20 NOTE — Telephone Encounter (Signed)
Refill sent to the pharmacy. Patient needs to schedule an office visit.

## 2021-08-20 NOTE — Telephone Encounter (Signed)
*  STAT* If patient is at the pharmacy, call can be transferred to refill team.   1. Which medications need to be refilled? (please list name of each medication and dose if known)  2.5 mg Amlodipine 30 mg Isosorbide Nitroglycerin tabs  2. Which pharmacy/location (including street and city if local pharmacy) is medication to be sent to?  2.5 mg Amlodipine 30 mg Isosorbide Nitroglycerin tabs  3. Do they need a 30 day or 90 day supply? 90 day

## 2021-08-29 ENCOUNTER — Other Ambulatory Visit (HOSPITAL_COMMUNITY): Payer: Self-pay | Admitting: Family Medicine

## 2021-08-29 ENCOUNTER — Other Ambulatory Visit: Payer: Self-pay | Admitting: Family Medicine

## 2021-08-29 DIAGNOSIS — R1013 Epigastric pain: Secondary | ICD-10-CM

## 2021-10-23 ENCOUNTER — Ambulatory Visit: Payer: BC Managed Care – PPO | Admitting: Cardiology

## 2021-12-12 ENCOUNTER — Encounter: Payer: Self-pay | Admitting: Nurse Practitioner

## 2022-01-03 ENCOUNTER — Ambulatory Visit: Payer: Self-pay | Admitting: Nurse Practitioner

## 2022-01-21 ENCOUNTER — Ambulatory Visit (INDEPENDENT_AMBULATORY_CARE_PROVIDER_SITE_OTHER): Payer: BLUE CROSS/BLUE SHIELD | Admitting: Cardiology

## 2022-01-21 ENCOUNTER — Encounter: Payer: Self-pay | Admitting: Cardiology

## 2022-01-21 VITALS — BP 100/70 | HR 74 | Ht 64.0 in | Wt 167.8 lb

## 2022-01-21 DIAGNOSIS — I201 Angina pectoris with documented spasm: Secondary | ICD-10-CM

## 2022-01-21 DIAGNOSIS — Z8249 Family history of ischemic heart disease and other diseases of the circulatory system: Secondary | ICD-10-CM | POA: Diagnosis not present

## 2022-01-21 MED ORDER — AMLODIPINE BESYLATE 2.5 MG PO TABS: 2.5000 mg | ORAL_TABLET | ORAL | 3 refills | Status: DC | PRN

## 2022-01-21 MED ORDER — NITROGLYCERIN 0.4 MG SL SUBL: SUBLINGUAL_TABLET | SUBLINGUAL | 5 refills | Status: DC

## 2022-01-21 MED ORDER — ISOSORBIDE MONONITRATE ER 30 MG PO TB24
ORAL_TABLET | ORAL | 3 refills | Status: DC
Start: 2022-01-21 — End: 2023-01-27

## 2022-01-21 NOTE — Patient Instructions (Addendum)

## 2022-01-21 NOTE — Progress Notes (Signed)
Primary Care Provider: [facility] Cardiologist: Glenetta Hew, MD Electrophysiologist: None  Clinic Note: Chief Complaint  Patient presents with   Follow-up    Delay, now almost 18 months.  Doing well.  Minimal symptoms now.    ===================================  ASSESSMENT/PLAN   Problem List Items Addressed This Visit       Cardiology Problems   Prinzmetal variant angina ([place]) - Primary (Chronic)    Finally, she seems to escape the more frequent episodes and is now very comfortable and knows how to handle them.  She intermittently has to use a little nitroglycerin but very rarely uses the "bigger gun "meds like amlodipine and Imdur.  1 time in the last year she has had to use amlodipine and Imdur.  She is avoiding triggers, staying adequately hydrated and has taken on a very healthy lifestyle.  I congratulated her efforts and I think this will help her out in the long run.       Relevant Medications   amLODipine (NORVASC) 2.5 MG tablet   isosorbide mononitrate (IMDUR) 30 MG 24 hr tablet   nitroGLYCERIN (NITROSTAT) 0.4 MG SL tablet   Other Relevant Orders   EKG 12-Lead (Completed)     Other   Family history of premature CAD    Minimal CAD noted on Coronary CTA.  Keep an eye on lipids and would not want LDL to get too far above 100.  As she gets older, may want to be a little more aggressive but for now we will try to avoid complications of medications.       Relevant Orders   EKG 12-Lead (Completed)    ===================================  HPI:    Ashlee [name] is a 41 y.o. female with a PMH notable for documented Prinzmetal Angina who presents today for 60-month follow-up at the request request of [facility]  Ashlee [name] was last seen on June 30, 2020 via telemedicine.  She was doing remarkably well.  She had taken both Imdur and amlodipine all the way through August and September of that year and then weaned off first  Imdur then amlodipine.  She really gets bad headaches with Imdur, but it does help the discomfort.  At that time after having weaned off of amlodipine, she may be at work short little spells reasonably with PRN nitroglycerin.  She was otherwise doing well. Plan was to continue using Imdur and amlodipine as needed for breakthrough spells, not easily treated with nitroglycerin sublingual.  Did recommend that she redo about 2 to 3 weeks of each medication and then wean off Imdur first followed by amlodipine.  Recent Hospitalizations: None  Reviewed  CV studies:    The following studies were reviewed today: (if available, images/films reviewed: From Epic Chart or Care Everywhere) None:  Interval History:   Ashlee [name] presents here today for follow-up doing remarkably well.  She is in great spirits.  She is really happy to announce that she is 5 years out from having quit smoking and a year ago she quit alcohol altogether.  She is walking daily, and has adjusted her diet dramatically.  She is making a concerted effort to try to stay as active and looks as possible because her ankylosing spondylitis. She always has a little bit of left greater than right-sided ankle swelling but no real edema.  She says that in the last year and a half she has had thick nitroglycerin just a couple times.  Last attempt  was a only time where she did take both amlodipine and Imdur.  Basically these episodes may be happening a few times a year but they can resolve quickly with nitroglycerin and only once twice.  She actually need to take a combination of Imdur and amlodipine.  CV Review of Symptoms (Summary) Cardiovascular ROS: no chest pain or dyspnea on exertion positive for - -very rare, fleeting episodes of chest pain negative for - edema, irregular heartbeat, orthopnea, palpitations, paroxysmal nocturnal dyspnea, rapid heart rate, shortness of breath, or lightheadedness, dizziness or wooziness, syncope/near syncope  or TIA/amaurosis fugax claudication  REVIEWED OF SYSTEMS   Review of Systems  Constitutional:  Positive for weight loss (Nominal a few pounds here and there.). Negative for malaise/fatigue (Energy levels doing well.).  HENT:  Negative for congestion and nosebleeds.   Respiratory:  Negative for cough, shortness of breath and wheezing.   Cardiovascular:        Per HPI  Genitourinary:  Negative for dysuria, flank pain, frequency and hematuria.  Musculoskeletal:  Positive for back pain.  Neurological:  Positive for dizziness (Rarely) and headaches (Also less frequent). Negative for weakness.  Psychiatric/Behavioral:  Negative for depression and memory loss. The patient is not nervous/anxious and does not have insomnia.    I have reviewed and (if needed) personally updated the patient's problem list, medications, allergies, past medical and surgical history, social and family history.   PAST MEDICAL HISTORY   Past Medical History:  Diagnosis Date   Ankylosing spondylitis (HCC)    Prinzmetal variant angina (HCC) 03/2017   Coronary calcium score was 0 with no evidence of coronary disease. However prolonged episodes of substernal chest pain relieved with nitroglycerin.    PAST SURGICAL HISTORY   Past Surgical History:  Procedure Laterality Date   ABLATION  2012   CESAREAN SECTION  2007, 2012   x 2    CORONARY CALCIUM SCORE /CTA  03/2017   . Coronary artery calcium score of 0 Agatston units, suggesting low risk for future cardiac events.  No coronary plaque or stenosis   NOSE SURGERY     TONSILLECTOMY  2009   TUBAL LIGATION  2012    There is no immunization history on file for this patient.  MEDICATIONS/ALLERGIES   Current Meds  Medication Sig   cetirizine (ZYRTEC) 10 MG tablet Take 10 mg by mouth daily.   COSENTYX SENSOREADY, 300 MG, 150 MG/ML SOAJ every 30 (thirty) days.    diphenhydrAMINE (BENADRYL) 25 MG tablet Take 25 mg by mouth as needed.   Multiple Vitamin  (MULTIVITAMIN) capsule Take 1 capsule by mouth daily.   pseudoephedrine (SUDAFED) 30 MG tablet Take 30 mg by mouth as needed.   [DISCONTINUED] amLODipine (NORVASC) 2.5 MG tablet Take 1 tablet (2.5 mg total) by mouth as needed.   [DISCONTINUED] isosorbide mononitrate (IMDUR) 30 MG 24 hr tablet TAKE ONE TABLET (30MG  TOTAL) BY MOUTH ATBEDTIME   [DISCONTINUED] nitroGLYCERIN (NITROSTAT) 0.4 MG SL tablet PLACE ONE TABLET UNDER THE TONGUE EVERY FIVE MINUTES AS NEEDED FOR CHEST PAIN. MAXIMUM DOSE OF THREE TABLETS.    Allergies  Allergen Reactions   Wellbutrin [Bupropion] Hives    SOCIAL HISTORY/FAMILY HISTORY   Reviewed in Epic:  Pertinent findings:  Social History   Tobacco Use   Smoking status: Former    Packs/day: 1.00    Types: E-cigarettes, Cigarettes    Quit date: 03/28/2017    Years since quitting: 4.8   Smokeless tobacco: Never   Tobacco comments:  Quit in 07/2013  Vaping Use   Vaping Use: Every day  Substance Use Topics   Alcohol use: Yes    Comment: occasionally   Drug use: No   Social History   Social History Narrative   Married with 3 children   Right handed   12 th grade education   1 cup coffee daily   Work: Dance movement psychotherapist for [employer] -PE, EKG, labs   Wt Readings from Last 3 Encounters:  01/21/22 167 lb 12.8 oz (76.1 kg)  06/30/20 165 lb (74.8 kg)  04/17/20 175 lb (79.4 kg)    Physical Exam: BP 100/70 (BP Location: Left Arm)   Pulse 74   Ht 5\' 4"  (1.626 m)   Wt 167 lb 12.8 oz (76.1 kg)   SpO2 100%   BMI 28.80 kg/m  Physical Exam Vitals reviewed.  Constitutional:      General: She is not in acute distress.    Appearance: Normal appearance. She is normal weight. She is not toxic-appearing or diaphoretic.  HENT:     Head: Normocephalic and atraumatic.  Neck:     Vascular: No carotid bruit or JVD.  Cardiovascular:     Rate and Rhythm: Normal rate and regular rhythm. No extrasystoles are present.    Chest Wall: PMI is  not displaced.     Pulses: Normal pulses.     Heart sounds: S1 normal and S2 normal.    No friction rub. No gallop.  Pulmonary:     Effort: Pulmonary effort is normal. No respiratory distress.     Breath sounds: Normal breath sounds. No wheezing, rhonchi or rales.  Chest:     Chest wall: No tenderness.  Musculoskeletal:     Cervical back: Normal range of motion and neck supple.  Skin:    General: Skin is warm and dry.  Neurological:     General: No focal deficit present.     Mental Status: She is alert and oriented to person, place, and time.     Motor: No weakness.     Gait: Gait normal.  Psychiatric:        Mood and Affect: Mood normal.        Behavior: Behavior normal.        Thought Content: Thought content normal.        Judgment: Judgment normal.     Adult ECG Report  Rate: 74;  Rhythm: normal sinus rhythm and normal axis,, intervals and durations. ;   Narrative Interpretation: Normal  Recent Labs:   08/18/2021: TC 183, TG 138, HDL 46, LDL 112.  Hgb 14.5, Cr 0.83.  K+ 4.0. No results found for: CHOL, HDL, LDLCALC, LDLDIRECT, TRIG, CHOLHDL Lab Results  Component Value Date   CREATININE 0.76 04/11/2017   BUN 12 04/11/2017   NA 140 04/11/2017   K 3.8 04/11/2017   CL 110 04/11/2017   CO2 22 04/11/2017      Latest Ref Rng & Units 04/11/2017   10:43 AM 03/29/2017    8:22 AM 03/26/2017    6:07 AM  CBC  WBC 4.0 - 10.5 K/uL 7.0   8.5   10.2    Hemoglobin 12.0 - 15.0 g/dL 12.9   13.6   13.6    Hematocrit 36.0 - 46.0 % 38.3   40.1   40.5    Platelets 150 - 400 K/uL 320   250   261      No results found for:  HGBA1C No results found for: TSH  ==================================================  COVID-19 Education: The signs and symptoms of COVID-19 were discussed with the patient and how to seek care for testing (follow up with PCP or arrange E-visit).    I spent a total of 23 minutes with the patient spent in direct patient consultation.  Additional time spent with  chart review  / charting (studies, outside notes, etc): 15 min Total Time: 38 min  Current medicines are reviewed at length with the patient today.  (+/- concerns) none  Notice: This dictation was prepared with Dragon dictation along with smart phrase technology. Any transcriptional errors that result from this process are unintentional and may not be corrected upon review.  Studies Ordered:   Orders Placed This Encounter  Procedures   EKG 12-Lead   Meds ordered this encounter  Medications   amLODipine (NORVASC) 2.5 MG tablet    Sig: Take 1 tablet (2.5 mg total) by mouth as needed.    Dispense:  90 tablet    Refill:  3   isosorbide mononitrate (IMDUR) 30 MG 24 hr tablet    Sig: TAKE ONE TABLET (30MG  TOTAL) BY MOUTH ATBEDTIME    Dispense:  90 tablet    Refill:  3   nitroGLYCERIN (NITROSTAT) 0.4 MG SL tablet    Sig: PLACE ONE TABLET UNDER THE TONGUE EVERY FIVE MINUTES AS NEEDED FOR CHEST PAIN. MAXIMUM DOSE OF THREE TABLETS.    Dispense:  25 tablet    Refill:  5    Patient Instructions / Medication Changes & Studies & Tests Ordered   Patient Instructions  Medication Instructions:  No changes   *If you need a refill on your cardiac medications before your next appointment, please call your pharmacy*   Lab Work: Not needed     Testing/Procedures:  Not needed  Follow-Up: At [facility], you and your health needs are our priority.  As part of our continuing mission to provide you with exceptional heart care, we have created designated Provider Care Teams.  These Care Teams include your primary Cardiologist (physician) and Advanced Practice Providers (APPs -  Physician Assistants and Nurse Practitioners) who all work together to provide you with the care you need, when you need it.     Your next appointment:   12 month(s)  The format for your next appointment:   In Person  Provider:   Glenetta Hew, MD        Glenetta Hew, M.D., M.S. Interventional  Cardiologist   Pager # [phone] Phone # [phone] [address] [place], [place] [address]   Thank you for choosing Heartcare at [facility]!!

## 2022-02-16 ENCOUNTER — Encounter: Payer: Self-pay | Admitting: Cardiology

## 2022-02-16 NOTE — Assessment & Plan Note (Signed)
Finally, she seems to escape the more frequent episodes and is now very comfortable and knows how to handle them.  She intermittently has to use a little nitroglycerin but very rarely uses the "bigger gun "meds like amlodipine and Imdur.  1 time in the last year she has had to use amlodipine and Imdur.  She is avoiding triggers, staying adequately hydrated and has taken on a very healthy lifestyle.  I congratulated her efforts and I think this will help her out in the long run.

## 2022-02-16 NOTE — Assessment & Plan Note (Signed)
Minimal CAD noted on Coronary CTA.  Keep an eye on lipids and would not want LDL to get too far above 100.  As she gets older, may want to be a little more aggressive but for now we will try to avoid complications of medications.

## 2022-07-31 ENCOUNTER — Other Ambulatory Visit: Payer: Self-pay

## 2022-07-31 ENCOUNTER — Emergency Department (HOSPITAL_BASED_OUTPATIENT_CLINIC_OR_DEPARTMENT_OTHER): Payer: BLUE CROSS/BLUE SHIELD | Admitting: Radiology

## 2022-07-31 ENCOUNTER — Encounter (HOSPITAL_BASED_OUTPATIENT_CLINIC_OR_DEPARTMENT_OTHER): Payer: Self-pay

## 2022-07-31 ENCOUNTER — Emergency Department (HOSPITAL_BASED_OUTPATIENT_CLINIC_OR_DEPARTMENT_OTHER)
Admission: EM | Admit: 2022-07-31 | Discharge: 2022-07-31 | Disposition: A | Discharge status: Home / Self Care | Payer: BLUE CROSS/BLUE SHIELD | Attending: Emergency Medicine | Admitting: Emergency Medicine

## 2022-07-31 DIAGNOSIS — R110 Nausea: Secondary | ICD-10-CM | POA: Insufficient documentation

## 2022-07-31 DIAGNOSIS — R61 Generalized hyperhidrosis: Secondary | ICD-10-CM | POA: Insufficient documentation

## 2022-07-31 DIAGNOSIS — R0602 Shortness of breath: Secondary | ICD-10-CM | POA: Insufficient documentation

## 2022-07-31 DIAGNOSIS — Z87891 Personal history of nicotine dependence: Secondary | ICD-10-CM | POA: Diagnosis not present

## 2022-07-31 DIAGNOSIS — R079 Chest pain, unspecified: Secondary | ICD-10-CM

## 2022-07-31 DIAGNOSIS — R0789 Other chest pain: Secondary | ICD-10-CM | POA: Insufficient documentation

## 2022-07-31 LAB — CBC
HCT: 38.5 % (ref 36.0–46.0)
Hemoglobin: 13 g/dL (ref 12.0–15.0)
MCH: 31.2 pg (ref 26.0–34.0)
MCHC: 33.8 g/dL (ref 30.0–36.0)
MCV: 92.3 fL (ref 80.0–100.0)
Platelets: 274 10*3/uL (ref 150–400)
RBC: 4.17 MIL/uL (ref 3.87–5.11)
RDW: 12.4 % (ref 11.5–15.5)
WBC: 6 10*3/uL (ref 4.0–10.5)
nRBC: 0 % (ref 0.0–0.2)

## 2022-07-31 LAB — BASIC METABOLIC PANEL
Anion gap: 11 (ref 5–15)
BUN: 8 mg/dL (ref 6–20)
CO2: 24 mmol/L (ref 22–32)
Calcium: 10.5 mg/dL — ABNORMAL HIGH (ref 8.9–10.3)
Chloride: 108 mmol/L (ref 98–111)
Creatinine, Ser: 0.79 mg/dL (ref 0.44–1.00)
GFR, Estimated: >60 mL/min (ref >60)
Glucose, Bld: 82 mg/dL (ref 70–99)
Potassium: 3.8 mmol/L (ref 3.5–5.1)
Sodium: 143 mmol/L (ref 135–145)

## 2022-07-31 LAB — TROPONIN I (HIGH SENSITIVITY): Troponin I (High Sensitivity): <2 ng/L (ref <18)

## 2022-07-31 NOTE — ED Triage Notes (Signed)
Onset 0200 am with chest pain.  Took nitro with no relief of chest pain.  States did relieve the nausea and sweating associated with it.  At present presser left side of chest.

## 2022-07-31 NOTE — Discharge Instructions (Addendum)
Follow-up with your cardiologist as discussed.  Restart your medications per cardiology plan.  Return to ER for worsening or concerning symptoms.

## 2022-07-31 NOTE — ED Provider Notes (Signed)
[facility] EMERGENCY DEPT Provider Note   CSN: [id number] Arrival date & time: 07/31/22  1052     History  Chief Complaint  Patient presents with   Chest Pain    Ashlee [name] is a 41 y.o. female.  41 year old female presents with complaint of left-sided chest pain.  Pain woke her from her sleep at 2:00 this morning, associated with nausea, shortness of breath, diaphoresis.  Patient took nitro x2 without improvement in her pain, did have improvement in her nausea, diaphoresis, shortness of breath.  Continued to have left-sided chest pressure.  Discomfort described as similar to prior prinzmetal angina episodes, states the last time she had an episode this bad was about 5 years ago, had syncope at that time, no syncope today.  Has not had an episode for 1 year.  Does have Imdur and amlodipine at home however not currently taking, weaned off of about a year ago. Former smoker, quit 5 years ago. Also history of ankylosing spondylitis       Home Medications Prior to Admission medications   Medication Sig Start Date End Date Taking? Authorizing Provider  cetirizine (ZYRTEC) 10 MG tablet Take 10 mg by mouth daily.   Yes [provider]  COSENTYX SENSOREADY, 300 MG, 150 MG/ML SOAJ every 30 (thirty) days.  11/15/19  Yes [provider]  diphenhydrAMINE (BENADRYL) 25 MG tablet Take 25 mg by mouth as needed.   Yes [provider]  doxycycline (MONODOX) 100 MG capsule SMARTSIG:1 Capsule(s) By Mouth 07/08/22  Yes [provider]  Multiple Vitamin (MULTIVITAMIN) capsule Take 1 capsule by mouth daily.   Yes [provider]  nitroGLYCERIN (NITROSTAT) 0.4 MG SL tablet PLACE ONE TABLET UNDER THE TONGUE EVERY FIVE MINUTES AS NEEDED FOR CHEST PAIN. MAXIMUM DOSE OF THREE TABLETS. 01/21/22  Yes Marykay Lex, MD  pseudoephedrine (SUDAFED) 30 MG tablet Take 30 mg by mouth as needed.   Yes [provider]  amLODipine (NORVASC) 2.5 MG  tablet Take 1 tablet (2.5 mg total) by mouth as needed. Patient not taking: Reported on 07/31/2022 01/21/22   Marykay Lex, MD  isosorbide mononitrate (IMDUR) 30 MG 24 hr tablet TAKE ONE TABLET (30MG  TOTAL) BY MOUTH ATBEDTIME Patient not taking: Reported on 07/31/2022 01/21/22   03/23/22, MD  valACYclovir (VALTREX) 1000 MG tablet Take 1,000 mg by mouth 2 (two) times daily. 07/08/22   [provider]      Allergies    Wellbutrin [bupropion]    Review of Systems   Review of Systems Negative except as per HPI Physical Exam Updated Vital Signs BP 116/82 (BP Location: Right Arm)   Pulse 73   Temp 98.4 F (36.9 C) (Oral)   Resp 18   SpO2 100%  Physical Exam Vitals and nursing note reviewed.  Constitutional:      General: She is not in acute distress.    Appearance: She is well-developed. She is not diaphoretic.  HENT:     Head: Normocephalic and atraumatic.  Cardiovascular:     Rate and Rhythm: Normal rate and regular rhythm.     Heart sounds: Normal heart sounds.  Pulmonary:     Effort: Pulmonary effort is normal.     Breath sounds: Normal breath sounds.  Chest:     Chest wall: No tenderness.  Abdominal:     Palpations: Abdomen is soft.     Tenderness: There is no abdominal tenderness.  Musculoskeletal:     Right lower leg: No  tenderness. No edema.     Left lower leg: No tenderness. No edema.  Skin:    General: Skin is warm and dry.     Findings: No erythema or rash.  Neurological:     Mental Status: She is alert and oriented to person, place, and time.  Psychiatric:        Behavior: Behavior normal.     ED Results / Procedures / Treatments   Labs (all labs ordered are listed, but only abnormal results are displayed) Labs Reviewed  BASIC METABOLIC PANEL - Abnormal; Notable for the following components:      Result Value   Calcium 10.5 (*)    All other components within normal limits  CBC  TROPONIN I (HIGH SENSITIVITY)    EKG EKG  Interpretation  Date/Time:  Wednesday July 31 2022 11:00:01 EST Ventricular Rate:  70 PR Interval:  145 QRS Duration: 88 QT Interval:  377 QTC Calculation: 407 R Axis:   63 Text Interpretation: Sinus rhythm ST elev, probable normal early repol pattern Confirmed by Glyn Ade [phone]) on 07/31/2022 12:12:51 PM  Radiology DG Chest 2 View  Result Date: 07/31/2022 CLINICAL DATA:  Chest pain EXAM: CHEST - 2 VIEW COMPARISON:  Chest 04/11/2017 FINDINGS: The heart size and mediastinal contours are within normal limits. Both lungs are clear. The visualized skeletal structures are unremarkable. IMPRESSION: No active cardiopulmonary disease. Electronically Signed   By: Marlan Palau M.D.   On: 07/31/2022 11:46    Procedures Procedures    Medications Ordered in ED Medications - No data to display  ED Course/ Medical Decision Making/ A&P Clinical Course as of 07/31/22 1239  Wed Jul 31, 2022  1213 Left sided chest pain. Took Nitrox2. Symptoms now resolved. Has meds at home for similar. [CC]    Clinical Course User Index [CC] Glyn Ade, MD                           Medical Decision Making  This patient presents to the ED for concern of chest pain, this involves an extensive number of treatment options, and is a complaint that carries with it a high risk of complications and morbidity.  The differential diagnosis includes but not limited to ACS, arrhythmia, Prinzmetal's angina   Co morbidities that complicate the patient evaluation  Ankylosing spondee-itis, Prinzmetal's angina, recent diagnosis of Lyme disease, just completed doxycycline, also told recurrence of mono   Additional history obtained:  Additional history obtained from husband at bedside who contributes to history as above External records from outside source obtained and reviewed including cardiology note dated 01/21/2022 outlining treatment plan. CT coronary study dated 04/11/2017, unremarkable.   Lab  Tests:  I Ordered, and personally interpreted labs.  The pertinent results include: CBC unremarkable, BMP without significant findings, troponin less than 2.   Imaging Studies ordered:  I ordered imaging studies including chest x-ray I independently visualized and interpreted imaging which showed no acute process I agree with the radiologist interpretation   Cardiac Monitoring: / EKG:  The patient was maintained on a cardiac monitor.  I personally viewed and interpreted the cardiac monitored which showed an underlying rhythm of: Sinus rhythm, rate 70.  Reviewed with the ER attending, Dr. Doran Durand, no change from prior.   Consultations Obtained:  I requested consultation with the attending, Dr. Doran Durand,  and discussed lab and imaging findings as well as pertinent plan - they recommend: Follow-up with cardiology, medications as previously recommended  by cardiology   Problem List / ED Course / Critical interventions / Medication management  41 year old female with complaint of chest pain as above. Nitro x 2 at home PTA without complete relief of pain, has persistent left side chest pressure. No chest wall tenderness on exam, no lower extremity edema, abdomen soft and non tender. EKG without ischemic changes, labs reassuring including negative troponin, CXR unremarkable. Discussed with ER attending, plan is for dc to follow up with cardiology, meds as previously prescribed, return to ER for worsening or concerning symptoms.  I have reviewed the patients home medicines and have made adjustments as needed   Social Determinants of Health:  Has PCP care, also sees [facility].  Followed by cardiology   Test / Admission - Considered:  Consider repeat troponin however with a negative troponin 10 hours after onset of pain, nonischemic EKG, repeat testing not necessary         Final Clinical Impression(s) / ED Diagnoses Final diagnoses:  Chest pain, unspecified type     Rx / DC Orders ED Discharge Orders     None         Jeannie Fend, PA-C 07/31/22 1239    Glyn Ade, MD 08/01/22 [phone]

## 2022-08-01 NOTE — Progress Notes (Signed)
Cardiology Clinic Note   Patient Name: Ashlee [name] Date of Encounter: 08/02/2022  Primary Care Provider:  [facility] Primary Cardiologist:  Bryan Lemma, MD  Patient Profile    Ashlee [name] 41 year old female presents to the clinic today for follow-up evaluation of her chest discomfort.  Past Medical History    Past Medical History:  Diagnosis Date   Ankylosing spondylitis (HCC)    Prinzmetal variant angina (HCC) 03/2017   Coronary calcium score was 0 with no evidence of coronary disease. However prolonged episodes of substernal chest pain relieved with nitroglycerin.   Past Surgical History:  Procedure Laterality Date   ABLATION  2012   CESAREAN SECTION  2007, 2012   x 2    CORONARY CALCIUM SCORE /CTA  03/2017   . Coronary artery calcium score of 0 Agatston units, suggesting low risk for future cardiac events.  No coronary plaque or stenosis   NOSE SURGERY     TONSILLECTOMY  2009   TUBAL LIGATION  2012    Allergies  Allergies  Allergen Reactions   Wellbutrin [Bupropion] Hives    History of Present Illness    Ashlee [name] has a past medical history of Prinzmetal angina, paresthesia, and a family history of premature CAD.  She had a coronary CT study 7/18 which was unremarkable.  She was seen in follow-up by Dr. Herbie Baltimore on 01/21/2022.  She had been seen 06/30/2020 via telemedicine appointment.  She was doing well at that time.  She had been taking both Imdur and amlodipine.  She had weaned Imdur and then amlodipine.  She noted headaches with Imdur however, it did help with her discomfort.  When she weaned off of her amlodipine she did note brief spells where she will use as needed sublingual nitroglycerin.  She was instructed to use Imdur and amlodipine as needed for breakthrough spells that were not treated with sublingual nitroglycerin.  During her 01/21/2022 follow-up she was having less frequent episodes and was handling her episodes of chest  discomfort well last as needed nitroglycerin.  She was rarely having to use amlodipine and Imdur.  She reported using Imdur and amlodipine only 1 time in the previous year.  She was avoiding triggers, staying adequately hydrated and practicing a healthy lifestyle.  She presented to the emergency department on 07/31/2022 with reports of left-sided chest pain.  She indicated that she woke up from pain at 2 AM.  She did have associated nausea shortness of breath and diaphoresis.  She took nitroglycerin x2 and noted improvement in her pain.  Her symptoms decreased.  She continued to have left-sided chest pressure which she described as similar to previous episodes of Prinzmetal angina.  Her previous episode to this extent was 5 years prior.  She noted syncope with her previous attack.  She denied syncope with this event.  She reported that her last episode was 1 year prior.  She reported having both Imdur and amlodipine however she was not taking medication.  She had weaned off the medication about a year ago.  Her blood pressure in the emergency department was noted to be 116/82 with pulse of 73.  Her EKG showed sinus rhythm with ST elevation, normal early repolarization pattern.  Her chest x-ray was unremarkable.  Troponins were negative.  CBC was also unremarkable.  Potassium 3.8, creatinine 0.79  She presents to the clinic today for follow-up evaluation states she feels well today.  She has had no further episodes  of chest discomfort.  She has continued to take amlodipine 2.5 mg.  She is unable to tolerate Imdur due to low blood pressure and headaches.  We reviewed the pathophysiology of amlodipine and plan for breakthrough episodes of chest discomfort.  I recommended that she avoid excess caffeine, chocolate, EtOH, dehydration.  We reviewed her recent visit to the emergency department.  She expressed understanding.  She presents with her sister who is a respiratory therapist.  I will refill her amlodipine and  nitroglycerin.  I also reviewed her recent EKG.  It was unchanged from prior.  We will plan follow-up with Dr. Herbie BaltimoreHarding as scheduled.  Patient denies chest pain, shortness of breath, lower extremity edema, fatigue, palpitations, melena, hematuria, hemoptysis, diaphoresis, weakness, presyncope, syncope, orthopnea, and PND.    Home Medications    Prior to Admission medications   Medication Sig Start Date End Date Taking? Authorizing Provider  amLODipine (NORVASC) 2.5 MG tablet Take 1 tablet (2.5 mg total) by mouth as needed. Patient not taking: Reported on 07/31/2022 01/21/22   Marykay LexHarding, David W, MD  cetirizine (ZYRTEC) 10 MG tablet Take 10 mg by mouth daily.    [provider]  COSENTYX SENSOREADY, 300 MG, 150 MG/ML SOAJ every 30 (thirty) days.  11/15/19   [provider]  diphenhydrAMINE (BENADRYL) 25 MG tablet Take 25 mg by mouth as needed.    [provider]  doxycycline (MONODOX) 100 MG capsule SMARTSIG:1 Capsule(s) By Mouth 07/08/22   [provider]  isosorbide mononitrate (IMDUR) 30 MG 24 hr tablet TAKE ONE TABLET (30MG  TOTAL) BY MOUTH ATBEDTIME Patient not taking: Reported on 07/31/2022 01/21/22   Marykay LexHarding, David W, MD  Multiple Vitamin (MULTIVITAMIN) capsule Take 1 capsule by mouth daily.    [provider]  nitroGLYCERIN (NITROSTAT) 0.4 MG SL tablet PLACE ONE TABLET UNDER THE TONGUE EVERY FIVE MINUTES AS NEEDED FOR CHEST PAIN. MAXIMUM DOSE OF THREE TABLETS. 01/21/22   Marykay LexHarding, David W, MD  pseudoephedrine (SUDAFED) 30 MG tablet Take 30 mg by mouth as needed.    [provider]  valACYclovir (VALTREX) 1000 MG tablet Take 1,000 mg by mouth 2 (two) times daily. 07/08/22   [provider]    Family History    Family History  Problem Relation Age of Onset   Hypertension Father    Skin cancer Sister    Heart failure Maternal Grandmother    Ankylosing spondylitis Maternal Grandmother        Her mother also had along with 4 sisters    Skin cancer Maternal Grandfather    Arrhythmia Paternal Grandmother    Diabetes Paternal Grandfather    Kidney cancer Paternal Grandfather    Cancer Sister    Cardiomyopathy Maternal Aunt [id number]       With LBBB   She indicated that her mother is alive. She indicated that her father is alive. She indicated that both of her sisters are alive. She indicated that her maternal grandmother is deceased. She indicated that her maternal grandfather is alive. She indicated that her paternal grandmother is alive. She indicated that her paternal grandfather is deceased. She indicated that the status of her maternal aunt is unknown.  Social History    Social History   Socioeconomic History   Marital status: Married    Spouse name: Not on file   Number of children: 3   Years of education: Not on file   Highest education level: Not on file  Occupational History   Occupation: Print production planneroffice manager  Comment: [employer]  Tobacco Use   Smoking status: Former    Packs/day: 1.00    Types: E-cigarettes, Cigarettes    Quit date: 03/28/2017    Years since quitting: 5.3   Smokeless tobacco: Never   Tobacco comments:    Quit in 07/2013  Vaping Use   Vaping Use: Former  Substance and Sexual Activity   Alcohol use: Yes    Comment: occasionally   Drug use: No   Sexual activity: Yes    Birth control/protection: Surgical  Other Topics Concern   Not on file  Social History Narrative   Married with 3 children   Right handed   12 th grade education   1 cup coffee daily   Work: Technical brewer for [employer]      Social Determinants of Health   Financial Resource Strain: Not on file  Food Insecurity: Not on file  Transportation Needs: Not on file  Physical Activity: Not on file  Stress: Not on file  Social Connections: Not on file  Intimate Partner Violence: Not on file     Review of Systems    General:  No chills, fever, night sweats or weight changes.  Cardiovascular:  No chest  pain, dyspnea on exertion, edema, orthopnea, palpitations, paroxysmal nocturnal dyspnea. Dermatological: No rash, lesions/masses Respiratory: No cough, dyspnea Urologic: No hematuria, dysuria Abdominal:   No nausea, vomiting, diarrhea, bright red blood per rectum, melena, or hematemesis Neurologic:  No visual changes, wkns, changes in mental status. All other systems reviewed and are otherwise negative except as noted above.  Physical Exam    VS:  BP 112/72 (BP Location: Left Arm, Patient Position: Sitting, Cuff Size: Normal)   Pulse 74   Ht 5\' 4"  (1.626 m)   Wt 154 lb 3.2 oz (69.9 kg)   SpO2 98%   BMI 26.47 kg/m  , BMI Body mass index is 26.47 kg/m. GEN: Well nourished, well developed, in no acute distress. HEENT: normal. Neck: Supple, no JVD, carotid bruits, or masses. Cardiac: RRR, no murmurs, rubs, or gallops. No clubbing, cyanosis, edema.  Radials/DP/PT 2+ and equal bilaterally.  Respiratory:  Respirations regular and unlabored, clear to auscultation bilaterally. GI: Soft, nontender, nondistended, BS + x 4. MS: no deformity or atrophy. Skin: warm and dry, no rash. Neuro:  Strength and sensation are intact. Psych: Normal affect.  Accessory Clinical Findings    Recent Labs: 07/31/2022: BUN 8; Creatinine, Ser 0.79; Hemoglobin 13.0; Platelets 274; Potassium 3.8; Sodium 143   Recent Lipid Panel No results found for: "CHOL", "TRIG", "HDL", "CHOLHDL", "VLDL", "LDLCALC", "LDLDIRECT"       ECG personally reviewed by me today-none today.    Coronary CTA 04/11/2017 FINDINGS: Non-cardiac: See separate report from [facility].   Calcium Score:  0 Agatston units.   Coronary Arteries: Right dominant with no anomalies   LM:  No plaque or stenosis.   LAD system:  No plaque or stenosis.   Circumflex system: Relatively small LCx system. There was a small ramus. No plaque or stenosis in the LCx system.   RCA:  No plaque or stenosis.   IMPRESSION: 1. Coronary  artery calcium score of 0 Agatston units, suggesting low risk for future cardiac events.   2.  No plaque or stenosis noted in the coronary arteries.   [facility]   Electronically Signed: By: Sales promotion account executive M.D. On: 04/11/2017 14:31   Assessment & Plan   1.  Prinzmetal angina-no episodes of chest discomfort since her visit to  the emergency department.  Reviewed treatment with amlodipine, Imdur, and sublingual nitroglycerin. Continue as needed nitroglycerin,  Continue amlodipine 2.5 mg at bedtime Avoid triggers, caffeine, chocolate, dehydration, EtOH etc.  Family history of premature CAD-had coronary CTA which showed minimal CAD. Continue to monitor fasting lipids Heart healthy low-sodium high-fiber diet  Disposition: Follow-up with Dr. Herbie Baltimore in 6 months.   Thomasene Ripple. Adyen Bifulco NP-C     08/02/2022, 9:30 AM [facility] [address] Office [phone] Fax [phone]    I spent 14 minutes examining this patient, reviewing medications, and using patient centered shared decision making involving her cardiac care.  Prior to her visit I spent greater than 20 minutes reviewing her past medical history,  medications, and prior cardiac tests.

## 2022-08-02 ENCOUNTER — Encounter: Payer: Self-pay | Admitting: General Practice

## 2022-08-02 ENCOUNTER — Ambulatory Visit: Payer: BLUE CROSS/BLUE SHIELD | Attending: General Practice | Admitting: General Practice

## 2022-08-02 VITALS — BP 112/72 | HR 74 | Ht 64.0 in | Wt 154.2 lb

## 2022-08-02 DIAGNOSIS — I201 Angina pectoris with documented spasm: Secondary | ICD-10-CM

## 2022-08-02 DIAGNOSIS — Z8249 Family history of ischemic heart disease and other diseases of the circulatory system: Secondary | ICD-10-CM | POA: Diagnosis not present

## 2022-08-02 MED ORDER — NITROGLYCERIN 0.4 MG SL SUBL: SUBLINGUAL_TABLET | SUBLINGUAL | 2 refills | Status: AC

## 2022-08-02 MED ORDER — AMLODIPINE BESYLATE 2.5 MG PO TABS: 2.5000 mg | ORAL_TABLET | ORAL | 3 refills | Status: AC | PRN

## 2022-08-02 NOTE — Patient Instructions (Signed)
Medication Instructions:  Your physician recommends that you continue on your current medications as directed. Please refer to the Current Medication list given to you today.  *If you need a refill on your cardiac medications before your next appointment, please call your pharmacy*  Lab Work: NONE ordered at this time of appointment   If you have labs (blood work) drawn today and your tests are completely normal, you will receive your results only by: MyChart Message (if you have MyChart) OR A paper copy in the mail If you have any lab test that is abnormal or we need to change your treatment, we will call you to review the results.  Testing/Procedures: NONE ordered at this time of appointment   Follow-Up: At [facility], you and your health needs are our priority.  As part of our continuing mission to provide you with exceptional heart care, we have created designated Provider Care Teams.  These Care Teams include your primary Cardiologist (physician) and Advanced Practice Providers (APPs -  Physician Assistants and Nurse Practitioners) who all work together to provide you with the care you need, when you need it.  Your next appointment:   6 month(s)  The format for your next appointment:   In Person  Provider:   Bryan Lemma, MD  or Edd Fabian, FNP        Other Instructions Avoid Caffeine, chocolate, alcohol and stay hydrated   Important Information About Sugar

## 2022-10-11 DIAGNOSIS — M722 Plantar fascial fibromatosis: Secondary | ICD-10-CM | POA: Diagnosis not present

## 2022-10-15 DIAGNOSIS — M722 Plantar fascial fibromatosis: Secondary | ICD-10-CM | POA: Diagnosis not present

## 2022-10-21 DIAGNOSIS — N943 Premenstrual tension syndrome: Secondary | ICD-10-CM | POA: Diagnosis not present

## 2022-10-21 DIAGNOSIS — R5383 Other fatigue: Secondary | ICD-10-CM | POA: Diagnosis not present

## 2022-11-14 DIAGNOSIS — M722 Plantar fascial fibromatosis: Secondary | ICD-10-CM | POA: Diagnosis not present

## 2022-12-23 DIAGNOSIS — E039 Hypothyroidism, unspecified: Secondary | ICD-10-CM | POA: Diagnosis not present

## 2022-12-23 DIAGNOSIS — E569 Vitamin deficiency, unspecified: Secondary | ICD-10-CM | POA: Diagnosis not present

## 2022-12-23 DIAGNOSIS — R5383 Other fatigue: Secondary | ICD-10-CM | POA: Diagnosis not present

## 2022-12-23 DIAGNOSIS — E559 Vitamin D deficiency, unspecified: Secondary | ICD-10-CM | POA: Diagnosis not present

## 2022-12-31 DIAGNOSIS — Z01419 Encounter for gynecological examination (general) (routine) without abnormal findings: Secondary | ICD-10-CM | POA: Diagnosis not present

## 2022-12-31 DIAGNOSIS — Z1151 Encounter for screening for human papillomavirus (HPV): Secondary | ICD-10-CM | POA: Diagnosis not present

## 2022-12-31 DIAGNOSIS — Z800 Family history of malignant neoplasm of digestive organs: Secondary | ICD-10-CM | POA: Diagnosis not present

## 2022-12-31 DIAGNOSIS — Z1231 Encounter for screening mammogram for malignant neoplasm of breast: Secondary | ICD-10-CM | POA: Diagnosis not present

## 2023-01-06 DIAGNOSIS — Z1231 Encounter for screening mammogram for malignant neoplasm of breast: Secondary | ICD-10-CM | POA: Diagnosis not present

## 2023-01-15 DIAGNOSIS — R922 Inconclusive mammogram: Secondary | ICD-10-CM | POA: Diagnosis not present

## 2023-01-15 DIAGNOSIS — R92333 Mammographic heterogeneous density, bilateral breasts: Secondary | ICD-10-CM | POA: Diagnosis not present

## 2023-01-27 ENCOUNTER — Encounter: Payer: Self-pay | Admitting: Cardiology

## 2023-01-27 ENCOUNTER — Ambulatory Visit: Payer: BC Managed Care – PPO | Attending: Cardiology | Admitting: Cardiology

## 2023-01-27 VITALS — BP 112/70 | HR 78 | Ht 64.0 in | Wt 170.4 lb

## 2023-01-27 DIAGNOSIS — Z8249 Family history of ischemic heart disease and other diseases of the circulatory system: Secondary | ICD-10-CM

## 2023-01-27 DIAGNOSIS — I201 Angina pectoris with documented spasm: Secondary | ICD-10-CM

## 2023-01-27 NOTE — Progress Notes (Signed)
Primary Care Provider: [facility] Cardiologist: Bryan Lemma, MD Electrophysiologist: None  Clinic Note: Chief Complaint  Patient presents with   Follow-up    6 months.   Chest Pain    Prinzmetal angina-has had a few spells but not nearly as bad as last November.    ===================================  ASSESSMENT/PLAN   Problem List Items Addressed This Visit       Cardiology Problems   Prinzmetal variant angina (HCC) - Primary (Chronic)    Overall pretty well-controlled.  She does not like taking Imdur because of the side effects and therefore has not yet started it this time around with her recurrent symptoms.  She tried to see how she can go with just using amlodipine. We talked about potentially having her just stay on it longer to make Her recent motor symptoms completely better.  Keep pills of Imdur, amlodipine and nitroglycerin.  Avoid triggers, maintain healthy lifestyle.  Strive to stay active and exercise.      Relevant Orders   EKG 12-Lead (Completed)     Other   Family history of premature CAD (Chronic)    Minimal CAD noted Coronary CTA several years ago.  Her LDL still is above 100.  Would follow closely.  I have not seen labs since last February.  Due to get labs checked.  If LDL goes up to 4, would consider initiating statin as it may help stabilize her microvasculature.      Relevant Orders   EKG 12-Lead (Completed)    ===================================  HPI:    Ashlee [name] is a 42 y.o. female with a PMH below who presents today for annual follow-up.  Ashlee [name] was last seen on Jan 21, 2022.  Doing well.  In great spirits.  Had quit smoking 5 years ago.  Also quit trying to stay active which is difficult because of her ankylosing spondylitis, but if she stops being active, then it becomes very.  She did not take any nitroglycerin in a year and a half.  For bad episodes she is taking both for short  period after..  Recent Hospitalizations:  07/31/2022-ER visit for chest pain.  Initially did not resolve with NTG x 2 but did have improvement of nausea diaphoresis and dyspnea.  She noted that this was the worst when she has had in 5 years, just not associated with syncope.  Had weaned off of Imdur & Amlodipine.   Saw Oris Drone 07/31/2022 -> restarted amlodipine & Imdur x ~ 2 months  Reviewed  CV studies:    The following studies were reviewed today: (if available, images/films reviewed: From Epic Chart or Care Everywhere) None:  Interval History:   Ashlee [name] returns today overall in good spirits.  She seems to have a quite a handle on her episodes of Prinzmetal angina.  After the episode in November she has not had 1 about severity, in fact that was probably the worst she has had since her diagnosis.  She actually had weaned herself off of the amlodipine and Imdur but this probably may end up going back on it again because about 2 weeks ago she started having More symptoms.  Initially these episodes resolved with nitroglycerin, but she is about to go away on vacation and does not really want to have to be on Imdur while on vacation.  She will start her amlodipine now. She tries stay active to keep her ankylosing spondylitis loose.  When she is  able to exercise, her back feels better but also she is less likely to have her chest pain episodes.  Her chest pain never occurs with exertion and usually always happens in the morning when she first wakes up.  She is a little bit reassured by the fact that she has not had an episode in about 3 days which usually means that the bout of symptoms has resolved.  She is hoping so before she goes on vacation.  CV Review of Symptoms (Summary): positive for - chest pain negative for - dyspnea on exertion, edema, irregular heartbeat, loss of consciousness, orthopnea, palpitations, paroxysmal nocturnal dyspnea, rapid heart rate, shortness of breath, or  dizziness or wooziness, lightheadedness, syncope/near syncope or TIA/amaurosis fugax, claudication  REVIEWED OF SYSTEMS   Review of Systems  Constitutional:  Positive for weight loss (She actually has been trying to lose weight, she says she is more weight when her back was hurting her now she wants to get it off.). Negative for malaise/fatigue.  HENT:  Positive for congestion.   Respiratory: Negative.    Cardiovascular:  Negative for leg swelling.  Gastrointestinal:  Negative for blood in stool and melena.  Genitourinary:  Negative for hematuria.  Musculoskeletal:  Positive for back pain (Ankylosing spondylitis).  Neurological:  Negative for dizziness and focal weakness.  Psychiatric/Behavioral:  Negative for memory loss. The patient is not nervous/anxious (She seems to have a great handle on the symptoms and no longer lets error chest pain episodes "freak her out".  She is able to respond appropriately.).    I have reviewed and (if needed) personally updated the patient's problem list, medications, allergies, past medical and surgical history, social and family history.   PAST MEDICAL HISTORY   Past Medical History:  Diagnosis Date   Ankylosing spondylitis (HCC)    Prinzmetal variant angina (HCC) 03/2017   Coronary calcium score was 0 with no evidence of coronary disease. However prolonged episodes of substernal chest pain relieved with nitroglycerin.    PAST SURGICAL HISTORY   Past Surgical History:  Procedure Laterality Date   ABLATION  2012   CESAREAN SECTION  2007, 2012   x 2    CORONARY CALCIUM SCORE /CTA  03/2017   . Coronary artery calcium score of 0 Agatston units, suggesting low risk for future cardiac events.  No coronary plaque or stenosis   NOSE SURGERY     TONSILLECTOMY  2009   TUBAL LIGATION  2012     There is no immunization history on file for this patient.  MEDICATIONS/ALLERGIES   Current Meds  Medication Sig   amLODipine (NORVASC) 2.5 MG tablet Take  1 tablet (2.5 mg total) by mouth as needed.   cetirizine (ZYRTEC) 10 MG tablet Take 10 mg by mouth daily.   COSENTYX SENSOREADY, 300 MG, 150 MG/ML SOAJ every 30 (thirty) days.    diphenhydrAMINE (BENADRYL) 25 MG tablet Take 25 mg by mouth as needed.   Multiple Vitamin (MULTIVITAMIN) capsule Take 1 capsule by mouth daily.   nitroGLYCERIN (NITROSTAT) 0.4 MG SL tablet PLACE ONE TABLET UNDER THE TONGUE EVERY FIVE MINUTES AS NEEDED FOR CHEST PAIN. MAXIMUM DOSE OF THREE TABLETS.   pseudoephedrine (SUDAFED) 30 MG tablet Take 30 mg by mouth as needed.    Allergies  Allergen Reactions   Wellbutrin [Bupropion] Hives    SOCIAL HISTORY/FAMILY HISTORY   Reviewed in Epic:  Pertinent findings:  Social History   Tobacco Use   Smoking status: Former    Packs/day: 1  Types: E-cigarettes, Cigarettes    Quit date: 03/28/2017    Years since quitting: 5.8   Smokeless tobacco: Never   Tobacco comments:    Quit in 07/2013  Vaping Use   Vaping Use: Former  Substance Use Topics   Alcohol use: Yes    Comment: occasionally   Drug use: No   Social History   Social History Narrative   Married with 3 children   Right handed   12 th grade education   1 cup coffee daily   Work: Technical brewer for [employer] -PE, EKG, labs   Wt Readings from Last 3 Encounters:  01/27/23 170 lb 6.4 oz (77.3 kg)  08/02/22 154 lb 3.2 oz (69.9 kg)  01/21/22 167 lb 12.8 oz (76.1 kg)    Physical Exam: BP 112/70   Pulse 78   Ht 5\' 4"  (1.626 m)   Wt 170 lb 6.4 oz (77.3 kg)   SpO2 99%   BMI 29.25 kg/m  Physical Exam Vitals reviewed.  Constitutional:      General: She is not in acute distress.    Appearance: Normal appearance. She is normal weight. She is not ill-appearing or toxic-appearing.  HENT:     Head: Normocephalic and atraumatic.  Cardiovascular:     Rate and Rhythm: Normal rate and regular rhythm.     Pulses: Normal pulses.     Heart sounds: Normal heart sounds. No murmur  heard.    No friction rub. No gallop.  Pulmonary:     Effort: Pulmonary effort is normal. No respiratory distress.     Breath sounds: Normal breath sounds. No wheezing, rhonchi or rales.  Musculoskeletal:        General: No swelling. Normal range of motion.     Cervical back: Normal range of motion and neck supple.  Skin:    General: Skin is warm and dry.     Coloration: Skin is not jaundiced.  Neurological:     General: No focal deficit present.     Mental Status: She is alert and oriented to person, place, and time. Mental status is at baseline.     Gait: Gait normal.  Psychiatric:        Mood and Affect: Mood normal.        Behavior: Behavior normal.        Thought Content: Thought content normal.        Judgment: Judgment normal.     Adult ECG Report  Rate: 78 ;  Rhythm: normal sinus rhythm; normal axis, intervals and durations.  Narrative Interpretation: Normal  Recent Labs: Reviewed Last lipid panel was from February 2023.: TC 168, TG 69, HDL 48, LDL 107 .;  A1c 5.2 No results found for: "CHOL", "HDL", "LDLCALC", "LDLDIRECT", "TRIG", "CHOLHDL" Lab Results  Component Value Date   CREATININE 0.79 07/31/2022   BUN 8 07/31/2022   NA 143 07/31/2022   K 3.8 07/31/2022   CL 108 07/31/2022   CO2 24 07/31/2022      Latest Ref Rng & Units 07/31/2022   11:25 AM 04/11/2017   10:43 AM 03/29/2017    8:22 AM  CBC  WBC 4.0 - 10.5 K/uL 6.0  7.0  8.5   Hemoglobin 12.0 - 15.0 g/dL 16.1  09.6  04.5   Hematocrit 36.0 - 46.0 % 38.5  38.3  40.1   Platelets 150 - 400 K/uL 274  320  250     No results found for: "  HGBA1C" No results found for: "TSH"  ================================================== I spent a total of 23 minutes with the patient spent in direct patient consultation.  Additional time spent with chart review  / charting (studies, outside notes, etc): 18 min => Notes reviewed. Total Time: 41 min  Current medicines are reviewed at length with the patient today.   (+/- concerns) N/A  Notice: This dictation was prepared with Dragon dictation along with smart phrase technology. Any transcriptional errors that result from this process are unintentional and may not be corrected upon review.  Studies Ordered:   Orders Placed This Encounter  Procedures   EKG 12-Lead   No orders of the defined types were placed in this encounter.   Patient Instructions / Medication Changes & Studies & Tests Ordered   Patient Instructions  Medication Instructions:  No changes  *If you need a refill on your cardiac medications before your next appointment, please call your pharmacy*   Lab Work: Not needed    Testing/Procedures: Not needed   Follow-Up: At [facility], you and your health needs are our priority.  As part of our continuing mission to provide you with exceptional heart care, we have created designated Provider Care Teams.  These Care Teams include your primary Cardiologist (physician) and Advanced Practice Providers (APPs -  Physician Assistants and Nurse Practitioners) who all work together to provide you with the care you need, when you need it.     Your next appointment:   12 month(s)  The format for your next appointment:   In Person  Provider:   Bryan Lemma, MD        Marykay Lex, MD, MS Bryan Lemma, M.D., M.S. Interventional Cardiologist  [facility]  Pager # [phone] Phone # [phone] [address] [place], [place] [address]   Thank you for choosing [facility] [place]!!

## 2023-01-27 NOTE — Patient Instructions (Signed)

## 2023-02-04 ENCOUNTER — Encounter: Payer: Self-pay | Admitting: Cardiology

## 2023-02-04 NOTE — Assessment & Plan Note (Signed)
Minimal CAD noted Coronary CTA several years ago.  Her LDL still is above 100.  Would follow closely.  I have not seen labs since last February.  Due to get labs checked.  If LDL goes up to 4, would consider initiating statin as it may help stabilize her microvasculature.

## 2023-02-04 NOTE — Assessment & Plan Note (Signed)
Overall pretty well-controlled.  She does not like taking Imdur because of the side effects and therefore has not yet started it this time around with her recurrent symptoms.  She tried to see how she can go with just using amlodipine. We talked about potentially having her just stay on it longer to make Her recent motor symptoms completely better.  Keep pills of Imdur, amlodipine and nitroglycerin.  Avoid triggers, maintain healthy lifestyle.  Strive to stay active and exercise.

## 2023-02-11 DIAGNOSIS — E663 Overweight: Secondary | ICD-10-CM | POA: Diagnosis not present

## 2023-02-11 DIAGNOSIS — Z6827 Body mass index (BMI) 27.0-27.9, adult: Secondary | ICD-10-CM | POA: Diagnosis not present

## 2023-02-11 DIAGNOSIS — J069 Acute upper respiratory infection, unspecified: Secondary | ICD-10-CM | POA: Diagnosis not present

## 2023-04-21 DIAGNOSIS — Z79899 Other long term (current) drug therapy: Secondary | ICD-10-CM | POA: Diagnosis not present

## 2023-04-21 DIAGNOSIS — M450 Ankylosing spondylitis of multiple sites in spine: Secondary | ICD-10-CM | POA: Diagnosis not present

## 2023-04-21 DIAGNOSIS — R5382 Chronic fatigue, unspecified: Secondary | ICD-10-CM | POA: Diagnosis not present

## 2023-04-21 DIAGNOSIS — M545 Low back pain, unspecified: Secondary | ICD-10-CM | POA: Diagnosis not present

## 2023-06-20 DIAGNOSIS — N898 Other specified noninflammatory disorders of vagina: Secondary | ICD-10-CM | POA: Diagnosis not present

## 2023-07-08 DIAGNOSIS — L709 Acne, unspecified: Secondary | ICD-10-CM | POA: Diagnosis not present

## 2023-07-08 DIAGNOSIS — N761 Subacute and chronic vaginitis: Secondary | ICD-10-CM | POA: Diagnosis not present

## 2023-08-28 DIAGNOSIS — Z20822 Contact with and (suspected) exposure to covid-19: Secondary | ICD-10-CM | POA: Diagnosis not present

## 2023-08-28 DIAGNOSIS — B349 Viral infection, unspecified: Secondary | ICD-10-CM | POA: Diagnosis not present

## 2023-08-28 DIAGNOSIS — J040 Acute laryngitis: Secondary | ICD-10-CM | POA: Diagnosis not present

## 2023-09-22 DIAGNOSIS — F329 Major depressive disorder, single episode, unspecified: Secondary | ICD-10-CM | POA: Diagnosis not present

## 2023-10-20 DIAGNOSIS — M450 Ankylosing spondylitis of multiple sites in spine: Secondary | ICD-10-CM | POA: Diagnosis not present

## 2023-10-20 DIAGNOSIS — R5382 Chronic fatigue, unspecified: Secondary | ICD-10-CM | POA: Diagnosis not present

## 2023-10-20 DIAGNOSIS — M545 Low back pain, unspecified: Secondary | ICD-10-CM | POA: Diagnosis not present

## 2023-10-20 DIAGNOSIS — Z111 Encounter for screening for respiratory tuberculosis: Secondary | ICD-10-CM | POA: Diagnosis not present

## 2023-10-20 DIAGNOSIS — Z79899 Other long term (current) drug therapy: Secondary | ICD-10-CM | POA: Diagnosis not present

## 2023-12-17 DIAGNOSIS — N943 Premenstrual tension syndrome: Secondary | ICD-10-CM | POA: Diagnosis not present

## 2023-12-17 DIAGNOSIS — I209 Angina pectoris, unspecified: Secondary | ICD-10-CM | POA: Diagnosis not present

## 2023-12-17 DIAGNOSIS — R5383 Other fatigue: Secondary | ICD-10-CM | POA: Diagnosis not present

## 2024-01-06 DIAGNOSIS — Z01419 Encounter for gynecological examination (general) (routine) without abnormal findings: Secondary | ICD-10-CM | POA: Diagnosis not present

## 2024-01-06 DIAGNOSIS — N649 Disorder of breast, unspecified: Secondary | ICD-10-CM | POA: Diagnosis not present

## 2024-01-26 DIAGNOSIS — N6002 Solitary cyst of left breast: Secondary | ICD-10-CM | POA: Diagnosis not present

## 2024-01-26 DIAGNOSIS — N6310 Unspecified lump in the right breast, unspecified quadrant: Secondary | ICD-10-CM | POA: Diagnosis not present

## 2024-01-26 DIAGNOSIS — N6320 Unspecified lump in the left breast, unspecified quadrant: Secondary | ICD-10-CM | POA: Diagnosis not present

## 2024-01-26 DIAGNOSIS — N6001 Solitary cyst of right breast: Secondary | ICD-10-CM | POA: Diagnosis not present

## 2024-02-02 DIAGNOSIS — E039 Hypothyroidism, unspecified: Secondary | ICD-10-CM | POA: Diagnosis not present

## 2024-02-02 DIAGNOSIS — G4700 Insomnia, unspecified: Secondary | ICD-10-CM | POA: Diagnosis not present

## 2024-02-02 DIAGNOSIS — E559 Vitamin D deficiency, unspecified: Secondary | ICD-10-CM | POA: Diagnosis not present

## 2024-02-02 DIAGNOSIS — I201 Angina pectoris with documented spasm: Secondary | ICD-10-CM | POA: Diagnosis not present

## 2024-02-02 DIAGNOSIS — I209 Angina pectoris, unspecified: Secondary | ICD-10-CM | POA: Diagnosis not present

## 2024-02-02 DIAGNOSIS — R5383 Other fatigue: Secondary | ICD-10-CM | POA: Diagnosis not present

## 2024-02-02 DIAGNOSIS — N6002 Solitary cyst of left breast: Secondary | ICD-10-CM | POA: Diagnosis not present

## 2024-02-02 DIAGNOSIS — K5900 Constipation, unspecified: Secondary | ICD-10-CM | POA: Diagnosis not present

## 2024-03-25 ENCOUNTER — Other Ambulatory Visit: Payer: Self-pay

## 2024-03-25 ENCOUNTER — Encounter (HOSPITAL_COMMUNITY): Payer: Self-pay

## 2024-03-25 ENCOUNTER — Emergency Department (HOSPITAL_COMMUNITY)
Admission: EM | Admit: 2024-03-25 | Discharge: 2024-03-25 | Disposition: A | Discharge status: Home / Self Care | Attending: Emergency Medicine | Admitting: Emergency Medicine

## 2024-03-25 ENCOUNTER — Emergency Department (HOSPITAL_COMMUNITY)

## 2024-03-25 DIAGNOSIS — R1013 Epigastric pain: Secondary | ICD-10-CM | POA: Diagnosis not present

## 2024-03-25 DIAGNOSIS — R110 Nausea: Secondary | ICD-10-CM | POA: Diagnosis not present

## 2024-03-25 DIAGNOSIS — K769 Liver disease, unspecified: Secondary | ICD-10-CM | POA: Diagnosis not present

## 2024-03-25 DIAGNOSIS — N200 Calculus of kidney: Secondary | ICD-10-CM | POA: Diagnosis not present

## 2024-03-25 DIAGNOSIS — K429 Umbilical hernia without obstruction or gangrene: Secondary | ICD-10-CM | POA: Diagnosis not present

## 2024-03-25 DIAGNOSIS — R1011 Right upper quadrant pain: Secondary | ICD-10-CM | POA: Diagnosis not present

## 2024-03-25 LAB — URINALYSIS, ROUTINE W REFLEX MICROSCOPIC
Bilirubin Urine: NEGATIVE
Glucose, UA: NEGATIVE mg/dL
Hgb urine dipstick: NEGATIVE
Ketones, ur: NEGATIVE mg/dL
Leukocytes,Ua: NEGATIVE
Nitrite: NEGATIVE
Protein, ur: NEGATIVE mg/dL
Specific Gravity, Urine: >1.046 — ABNORMAL HIGH (ref 1.005–1.030)
pH: 5 (ref 5.0–8.0)

## 2024-03-25 LAB — CBC WITH DIFFERENTIAL/PLATELET
Abs Immature Granulocytes: 0 K/uL (ref 0.00–0.07)
Band Neutrophils: 1 %
Basophils Absolute: 0.1 K/uL (ref 0.0–0.1)
Basophils Relative: 1 %
Eosinophils Absolute: 1.6 K/uL — ABNORMAL HIGH (ref 0.0–0.5)
Eosinophils Relative: 17 %
HCT: 44.6 % (ref 36.0–46.0)
Hemoglobin: 14.9 g/dL (ref 12.0–15.0)
Lymphocytes Relative: 30 %
Lymphs Abs: 2.9 K/uL (ref 0.7–4.0)
MCH: 31.6 pg (ref 26.0–34.0)
MCHC: 33.4 g/dL (ref 30.0–36.0)
MCV: 94.7 fL (ref 80.0–100.0)
Monocytes Absolute: 0.3 K/uL (ref 0.1–1.0)
Monocytes Relative: 3 %
Neutro Abs: 4.8 K/uL (ref 1.7–7.7)
Neutrophils Relative %: 48 %
Platelets: 294 K/uL (ref 150–400)
RBC: 4.71 MIL/uL (ref 3.87–5.11)
RDW: 12.8 % (ref 11.5–15.5)
WBC: 9.7 K/uL (ref 4.0–10.5)
nRBC: 0 % (ref 0.0–0.2)

## 2024-03-25 LAB — COMPREHENSIVE METABOLIC PANEL WITH GFR
ALT: 18 U/L (ref 0–44)
AST: 20 U/L (ref 15–41)
Albumin: 4.5 g/dL (ref 3.5–5.0)
Alkaline Phosphatase: 43 U/L (ref 38–126)
Anion gap: 11 (ref 5–15)
BUN: 15 mg/dL (ref 6–20)
CO2: 23 mmol/L (ref 22–32)
Calcium: 9.7 mg/dL (ref 8.9–10.3)
Chloride: 106 mmol/L (ref 98–111)
Creatinine, Ser: 0.75 mg/dL (ref 0.44–1.00)
GFR, Estimated: >60 mL/min (ref >60)
Glucose, Bld: 93 mg/dL (ref 70–99)
Potassium: 4.1 mmol/L (ref 3.5–5.1)
Sodium: 140 mmol/L (ref 135–145)
Total Bilirubin: 0.5 mg/dL (ref 0.0–1.2)
Total Protein: 7.9 g/dL (ref 6.5–8.1)

## 2024-03-25 LAB — LIPASE, BLOOD: Lipase: 33 U/L (ref 11–51)

## 2024-03-25 MED ORDER — PANTOPRAZOLE SODIUM 40 MG PO TBEC: 40.0000 mg | DELAYED_RELEASE_TABLET | Freq: Every day | ORAL | 0 refills | Status: AC

## 2024-03-25 MED ORDER — IOHEXOL 300 MG/ML  SOLN
100.0000 mL | Freq: Once | INTRAMUSCULAR | Status: AC | PRN
  Administered 2024-03-25: 100 mL via INTRAVENOUS

## 2024-03-25 MED ORDER — ONDANSETRON HCL 4 MG PO TABS: 4.0000 mg | ORAL_TABLET | Freq: Four times a day (QID) | ORAL | 0 refills | Status: AC

## 2024-03-25 NOTE — ED Triage Notes (Signed)
 Pt arrived via POV c/o epigastric abdominal pain X 6 days. Pt endorses nausea w/o emesis, diarrhea, and reports feeling very bloated with increase flatulence. Pt reports OTC medications have not helped.

## 2024-03-25 NOTE — Discharge Instructions (Signed)
 As discussed, I recommend bland diet avoid spicy greasy foods and alcohol.  Start the pantoprazole  daily as directed.  Have prescribed Zofran  to take as needed for nausea vomiting.  Please follow-up with your primary care provider for recheck.  If your symptoms continue you may need ultrasound of your gallbladder.  Please return to the ER if you develop any worsening symptoms fever or persistent vomiting

## 2024-03-25 NOTE — ED Provider Notes (Signed)
 [place] EMERGENCY DEPARTMENT AT [facility] Provider Note   CSN: [id number] Arrival date & time: 03/25/24  1212     Patient presents with: Abdominal Pain   Ashlee [name] is a 43 y.o. female.    Abdominal Pain Associated symptoms: no chest pain, no chills, no fever and no shortness of breath        Ashlee [name] is a 43 y.o. female who presents to the Emergency Department complaining of epigastric right upper quadrant pain for nearly 1 week.  Pain has been constant since onset and waxes and wanes in severity.  Pain has been associated with nausea but no vomiting or diarrhea.  She describes increased flatus and bloating of her abdomen.  She does admit to daily alcohol use.  She is concerned that she may have pancreatitis.  Also states that she has had a family member died from pancreatic cancer.  She has tried over-the-counter medications without relief.  She does state the pain at times, radiates into her mid to lower back.  She denies any dysuria, fever or chills.  Prior to Admission medications   Medication Sig Start Date End Date Taking? Authorizing Provider  amLODipine  (NORVASC ) 2.5 MG tablet Take 1 tablet (2.5 mg total) by mouth as needed. 08/02/22   Emelia Josefa HERO, NP  cetirizine (ZYRTEC) 10 MG tablet Take 10 mg by mouth daily.    [provider]  COSENTYX SENSOREADY, 300 MG, 150 MG/ML SOAJ every 30 (thirty) days.  11/15/19   [provider]  diphenhydrAMINE  (BENADRYL ) 25 MG tablet Take 25 mg by mouth as needed.    [provider]  Multiple Vitamin (MULTIVITAMIN) capsule Take 1 capsule by mouth daily.    [provider]  nitroGLYCERIN  (NITROSTAT ) 0.4 MG SL tablet PLACE ONE TABLET UNDER THE TONGUE EVERY FIVE MINUTES AS NEEDED FOR CHEST PAIN. MAXIMUM DOSE OF THREE TABLETS. 08/02/22   Emelia Josefa HERO, NP  pseudoephedrine (SUDAFED) 30 MG tablet Take 30 mg by mouth as needed.    [provider]    Allergies: Wellbutrin  [bupropion]    Review of Systems  Constitutional:  Negative for chills and fever.  Respiratory:  Negative for shortness of breath.   Cardiovascular:  Negative for chest pain.  Gastrointestinal:  Positive for abdominal pain.  Neurological:  Negative for dizziness, weakness and numbness.    Updated Vital Signs BP 104/69 (BP Location: Right Arm)   Pulse 68   Temp 97.9 F (36.6 C) (Oral)   Resp 16   Ht 5' 4 (1.626 m)   Wt 77.3 kg   SpO2 100%   BMI 29.25 kg/m   Physical Exam Vitals and nursing note reviewed.  Constitutional:      Appearance: Normal appearance. She is well-developed. She is not toxic-appearing.  HENT:     Mouth/Throat:     Mouth: Mucous membranes are moist.  Cardiovascular:     Rate and Rhythm: Normal rate and regular rhythm.     Pulses: Normal pulses.  Pulmonary:     Effort: Pulmonary effort is normal.  Chest:     Chest wall: No tenderness.  Abdominal:     General: There is no distension.     Palpations: Abdomen is soft.     Tenderness: There is abdominal tenderness in the epigastric area. There is no right CVA tenderness or left CVA tenderness.  Musculoskeletal:        General: Normal range of motion.     Right lower  leg: No edema.     Left lower leg: No edema.  Skin:    General: Skin is warm.     Capillary Refill: Capillary refill takes less than 2 seconds.  Neurological:     General: No focal deficit present.     Mental Status: She is alert.     Sensory: No sensory deficit.     Motor: No weakness.     (all labs ordered are listed, but only abnormal results are displayed) Labs Reviewed  URINALYSIS, ROUTINE W REFLEX MICROSCOPIC - Abnormal; Notable for the following components:      Result Value   Color, Urine STRAW (*)    Specific Gravity, Urine >1.046 (*)    All other components within normal limits  CBC WITH DIFFERENTIAL/PLATELET - Abnormal; Notable for the following components:   Eosinophils Absolute 1.6 (*)    All other components  within normal limits  LIPASE, BLOOD  COMPREHENSIVE METABOLIC PANEL WITH GFR  PATHOLOGIST SMEAR REVIEW    EKG: None  Radiology: CT ABDOMEN PELVIS W CONTRAST Result Date: 03/25/2024 CLINICAL DATA:  Abdominal pain, acute, nonlocalized. EXAM: CT ABDOMEN AND PELVIS WITH CONTRAST TECHNIQUE: Multidetector CT imaging of the abdomen and pelvis was performed using the standard protocol following bolus administration of intravenous contrast. RADIATION DOSE REDUCTION: This exam was performed according to the departmental dose-optimization program which includes automated exposure control, adjustment of the mA and/or kV according to patient size and/or use of iterative reconstruction technique. CONTRAST:  OMNIPAQUE  IOHEXOL  300 MG/ML  SOLN COMPARISON:  CT scan abdomen and pelvis from 07/14/2014. FINDINGS: Lower chest: The lung bases are clear. No pleural effusion. The heart is normal in size. No pericardial effusion. Hepatobiliary: The liver is normal in size. Non-cirrhotic configuration. Redemonstration of a heterogeneous hypoattenuating 2.7 x 3.3 cm lesion in the right hepatic lobe segment 6/7, incompletely characterized on the current exam but can be characterized as a hemangioma on the basis of prior exam from 07/14/2014. No significant interval change. No other focal liver lesion. No intrahepatic or extrahepatic bile duct dilation. No calcified gallstones. Normal gallbladder wall thickness. No pericholecystic inflammatory changes. Pancreas: Unremarkable. No pancreatic ductal dilatation or surrounding inflammatory changes. Spleen: Normal in size. Redemonstration of a subcapsular 1.2 x 1.3 cm cyst arising from inferomedial aspect, slightly decreased in size since the prior study. Adrenals/Urinary Tract: Adrenal glands are unremarkable. There is a 2 x 3 mm nonobstructing calculus in the left kidney lower pole calyx. No other nephroureterolithiasis or obstructive uropathy on either side. No suspicious renal  mass. Unremarkable urinary bladder. Stomach/Bowel: No disproportionate dilation of the small or large bowel loops. No evidence of abnormal bowel wall thickening or inflammatory changes. The appendix is unremarkable. Vascular/Lymphatic: No ascites or pneumoperitoneum. No abdominal or pelvic lymphadenopathy, by size criteria. No aneurysmal dilation of the major abdominal arteries. Reproductive: Not well evaluated on the CT scan exam. However, having said that, note is made of normal-size anteverted uterus. Redemonstration of 2 fluid collections, 1 in the left side of the fundus of the uterus and other in the lower uterine body, present since the prior study and essentially unchanged. No large adnexal mass seen. Other: There is a tiny fat containing umbilical hernia. The soft tissues and abdominal wall are otherwise unremarkable. Musculoskeletal: No suspicious osseous lesions. IMPRESSION: 1. No acute inflammatory process identified within the abdomen or pelvis. 2. There is a 2 x 3 mm nonobstructing calculus in the left kidney lower pole calyx. No other nephroureterolithiasis or obstructive uropathy on  either side. 3. Multiple other nonacute observations, as described above. Electronically Signed   By: Ree Molt M.D.   On: 03/25/2024 16:24     Procedures   Medications Ordered in the ED  iohexol  (OMNIPAQUE ) 300 MG/ML solution 100 mL (100 mLs Intravenous Contrast Given 03/25/24 1555)                                    Medical Decision Making Patient here with epigastric pain for 1 week.  No vomiting or fever.  No flank pain or dysuria.  Patient has concern for pancreatitis and has had previous family member pass away from pancreatic cancer   Patient well-appearing on exam.  She has some epigastric and right upper quadrant tenderness but her abdomen is soft without guarding or rebound.   Differential would include but not limited to acute cholecystitis, cholelithiasis, gastritis PUD, pancreatitis,  viral process  Amount and/or Complexity of Data Reviewed Labs: ordered.    Details: Labs are overall reassuring, LFTs are unremarkable as well as her lipase.  There is no leukocytosis Radiology: ordered.    Details: CT abdomen and pelvis ordered   CT without evidence of acute inflammatory process within the abdomen or pelvis.  No obstructive uropathy Discussion of management or test interpretation with external provider(s): Patient here with epigastric and some mild right upper quadrant tenderness waxing and waning in severity for 1 week.  She is without vomiting or fever.  CT without acute finding, specifically no indications for acute pancreatitis.  I have explained that ultrasound would be more beneficial in the setting of gallbladder disease.  I have offered ultrasound imaging, but patient declined stating that she prefers discharge home at this time as she was more concerned about her pancreas and states she will follow-up with her PCP.  I feel it is reasonable to start PPI, prescription for antiemetic as well.  Discussed importance of bland diet and alcohol cessation.  Given return precautions and she verbalized understanding  Risk Prescription drug management.        Final diagnoses:  Epigastric pain    ED Discharge Orders     None          Herlinda Milling, PA-C 03/26/24 1341    Suzette Pac, MD 03/27/24 [phone]

## 2024-03-26 DIAGNOSIS — N958 Other specified menopausal and perimenopausal disorders: Secondary | ICD-10-CM | POA: Diagnosis not present

## 2024-03-26 DIAGNOSIS — M450 Ankylosing spondylitis of multiple sites in spine: Secondary | ICD-10-CM | POA: Diagnosis not present

## 2024-03-26 DIAGNOSIS — E663 Overweight: Secondary | ICD-10-CM | POA: Diagnosis not present

## 2024-03-26 DIAGNOSIS — R1013 Epigastric pain: Secondary | ICD-10-CM | POA: Diagnosis not present

## 2024-03-26 DIAGNOSIS — Z6828 Body mass index (BMI) 28.0-28.9, adult: Secondary | ICD-10-CM | POA: Diagnosis not present

## 2024-03-26 LAB — PATHOLOGIST SMEAR REVIEW
